# Patient Record
Sex: Female | Born: 1975 | Race: White | Hispanic: No | Smoking: Never smoker
Health system: Southern US, Community
[De-identification: ages and names within clinical notes are randomized; demographics above are authoritative.]

## PROBLEM LIST (undated history)

## (undated) DIAGNOSIS — F32A Depression, unspecified: Secondary | ICD-10-CM

## (undated) DIAGNOSIS — I10 Essential (primary) hypertension: Secondary | ICD-10-CM

## (undated) DIAGNOSIS — U071 COVID-19: Secondary | ICD-10-CM

## (undated) DIAGNOSIS — F329 Major depressive disorder, single episode, unspecified: Secondary | ICD-10-CM

## (undated) DIAGNOSIS — E785 Hyperlipidemia, unspecified: Secondary | ICD-10-CM

## (undated) HISTORY — PX: WISDOM TOOTH EXTRACTION: SHX21

## (undated) HISTORY — DX: Essential (primary) hypertension: I10

## (undated) HISTORY — DX: Hyperlipidemia, unspecified: E78.5

## (undated) HISTORY — DX: Depression, unspecified: F32.A

## (undated) HISTORY — DX: Major depressive disorder, single episode, unspecified: F32.9

## (undated) HISTORY — DX: COVID-19: U07.1

---

## 2007-04-07 ENCOUNTER — Emergency Department: Payer: Self-pay | Admitting: Emergency Medicine

## 2007-04-24 ENCOUNTER — Encounter: Payer: Self-pay | Admitting: Family Medicine

## 2007-04-28 ENCOUNTER — Encounter: Payer: Self-pay | Admitting: Family Medicine

## 2009-04-11 ENCOUNTER — Encounter: Payer: Self-pay | Admitting: Maternal & Fetal Medicine

## 2009-04-12 ENCOUNTER — Encounter: Payer: Self-pay | Admitting: Pediatric Cardiology

## 2009-04-26 ENCOUNTER — Observation Stay: Payer: Self-pay | Admitting: Obstetrics and Gynecology

## 2009-06-03 ENCOUNTER — Observation Stay: Payer: Self-pay | Admitting: Obstetrics and Gynecology

## 2009-06-07 ENCOUNTER — Inpatient Hospital Stay: Payer: Self-pay | Admitting: Obstetrics and Gynecology

## 2010-03-22 ENCOUNTER — Emergency Department: Payer: Self-pay | Admitting: Emergency Medicine

## 2017-04-15 ENCOUNTER — Ambulatory Visit (INDEPENDENT_AMBULATORY_CARE_PROVIDER_SITE_OTHER): Payer: BC Managed Care – PPO | Admitting: Certified Nurse Midwife

## 2017-04-15 ENCOUNTER — Encounter: Payer: Self-pay | Admitting: Certified Nurse Midwife

## 2017-04-15 VITALS — BP 119/76 | HR 56 | Ht 64.0 in | Wt 243.3 lb

## 2017-04-15 DIAGNOSIS — N898 Other specified noninflammatory disorders of vagina: Secondary | ICD-10-CM | POA: Diagnosis not present

## 2017-04-15 DIAGNOSIS — R638 Other symptoms and signs concerning food and fluid intake: Secondary | ICD-10-CM

## 2017-04-15 DIAGNOSIS — Z0000 Encounter for general adult medical examination without abnormal findings: Secondary | ICD-10-CM

## 2017-04-15 DIAGNOSIS — Z01419 Encounter for gynecological examination (general) (routine) without abnormal findings: Secondary | ICD-10-CM | POA: Diagnosis not present

## 2017-04-15 NOTE — Progress Notes (Signed)
New pt is here for a pap smear. No history of abnormal.States she needs help losing weight. Last period was not normal for her and spotted 8/1-03/28/17,04/03/17 and 04/06/17. Also states she has a discharge with a "sweaty" odor.

## 2017-04-15 NOTE — Patient Instructions (Signed)

## 2017-04-15 NOTE — Progress Notes (Signed)
ANNUAL PREVENTATIVE CARE GYN  ENCOUNTER NOTE  Subjective:       Emily Huffman is a 41 y.o. G16P3003 female here for a routine annual gynecologic exam.  Current complaints: 1.  Unable to loose weight. She states that she has been trying to loose weight for the past year with exercise and diet and has little to no success. She said she was exercising 3-4 days a week but Is not currently. She states she has changed her diet.   2. Increased vaginal discharge for " a while", no burning or itching. She does complain of an odor.    Gynecologic History Patient's last menstrual period was 03/25/2017 (exact date). Contraception: condoms Last Pap: a few years ago. Results were: normal per pt.  Last mammogram: never had, ordered .}  Obstetric History OB History  Gravida Para Term Preterm AB Living  3 3 3     3   SAB TAB Ectopic Multiple Live Births          3    # Outcome Date GA Lbr Len/2nd Weight Sex Delivery Anes PTL Lv  3 Term 2010    F Vag-Spont  N LIV  2 Term 2001    M Vag-Spont  N LIV  1 Term 1999    F Vag-Spont  N LIV      History reviewed. No pertinent past medical history.  History reviewed. No pertinent surgical history.  No current outpatient prescriptions on file prior to visit.   No current facility-administered medications on file prior to visit.     Not on File  Social History   Social History  . Marital status: Divorced    Spouse name: N/A  . Number of children: N/A  . Years of education: N/A   Occupational History  . Not on file.   Social History Main Topics  . Smoking status: Never Smoker  . Smokeless tobacco: Never Used  . Alcohol use Yes     Comment: occas  . Drug use: No  . Sexual activity: Yes    Birth control/ protection: Condom   Other Topics Concern  . Not on file   Social History Narrative  . No narrative on file    Family History  Problem Relation Age of Onset  . Stroke Father   . Diabetes Maternal Grandmother   . Stroke Paternal  Grandmother     The following portions of the patient's history were reviewed and updated as appropriate: allergies, current medications, past family history, past medical history, past social history, past surgical history and problem list.  Review of Systems ROS Review of Systems - General ROS: negative for - chills, fatigue, fever, hot flashes, night sweats, weight gain or weight loss Psychological ROS: negative for - anxiety, decreased libido, depression, mood swings, physical abuse or sexual abuse Ophthalmic ROS: negative for - blurry vision, eye pain or loss of vision ENT ROS: negative for - headaches, hearing change, visual changes or vocal changes Allergy and Immunology ROS: negative for - hives, itchy/watery eyes or seasonal allergies Hematological and Lymphatic ROS: negative for - bleeding problems, bruising, swollen lymph nodes or weight loss Endocrine ROS: negative for - galactorrhea, hair pattern changes, hot flashes, malaise/lethargy, mood swings, palpitations, polydipsia/polyuria, skin changes, temperature intolerance or unexpected weight changes. Positive for inability to lose weight. Breast ROS: negative for - new or changing breast lumps or nipple discharge Respiratory ROS: negative for - cough or shortness of breath Cardiovascular ROS: negative for - chest pain, irregular  heartbeat, palpitations or shortness of breath Gastrointestinal ROS: no abdominal pain, change in bowel habits, or black or bloody stools Genito-Urinary ROS: no dysuria, trouble voiding, or hematuria Musculoskeletal ROS: negative for - joint pain or joint stiffness Neurological ROS: negative for - bowel and bladder control changes Dermatological ROS: negative for rash and skin lesion changes   Objective:   BP 130/89   Pulse (!) 57   Ht 5\' 4"  (1.626 m)   Wt 243 lb 5 oz (110.4 kg)   LMP 03/25/2017 (Exact Date)   BMI 41.76 kg/m  CONSTITUTIONAL: Well-developed, well-nourished, obese female in no acute  distress.  PSYCHIATRIC: Normal mood and affect. Normal behavior. Normal judgment and thought content. NEUROLGIC: Alert and oriented to person, place, and time. Normal muscle tone coordination. No cranial nerve deficit noted. HENT:  Normocephalic, atraumatic, External right and left ear normal. Oropharynx is clear and moist EYES: Conjunctivae and EOM are normal. . No scleral icterus.  NECK: Normal range of motion, supple, no masses.  Normal thyroid. Slightly larger in pt right side.  SKIN: Skin is warm and dry. No rash noted. Not diaphoretic. No erythema. No pallor. CARDIOVASCULAR: Normal heart rate noted, regular rhythm, no murmur. RESPIRATORY: Clear to auscultation bilaterally. Effort and breath sounds normal, no problems with respiration noted. BREASTS: Symmetric in size. No masses, skin changes, nipple drainage, or lymphadenopathy. ABDOMEN: Soft, normal bowel sounds, no distention noted.  No tenderness, rebound or guarding.  BLADDER: Normal PELVIC:  External Genitalia: Normal  BUS: Normal  Vagina: Normal , clear to white discharge with odor  Cervix: Normal  Uterus: Normal, difficult to assess due to body habitus  Adnexa: Normal, difficult to evaluate due to body habitus  RV: External Exam NormaI  MUSCULOSKELETAL: Normal range of motion. No tenderness.  No cyanosis, clubbing, or edema.  2+ distal pulses. LYMPHATIC: No Axillary, Supraclavicular, or Inguinal Adenopathy.    Assessment:   Annual gynecologic examination 41 y.o. Contraception: condoms Obesity 2 Problem List Items Addressed This Visit    None      Plan:  Pap: Pap IG and HPV Mammogram: Ordered Stool Guaiac Testing:  Not Indicated Labs: Nuswab for BV & yeast, Lipid 1, TSH and Hemoglobin A1C Routine preventative health maintenance measures emphasized: Exercise/Diet/Weight control and Stress Management Will return to complete lab draw. Will follow up with results. Recommended pt see Melody Shambly, CNM to discuss  weight loss management.  Return to Clinic - 1 Year or PRN  Doreene Burke, CNM

## 2017-04-18 ENCOUNTER — Other Ambulatory Visit: Payer: BC Managed Care – PPO

## 2017-04-18 ENCOUNTER — Telehealth: Payer: Self-pay

## 2017-04-18 DIAGNOSIS — Z0000 Encounter for general adult medical examination without abnormal findings: Secondary | ICD-10-CM

## 2017-04-18 DIAGNOSIS — R638 Other symptoms and signs concerning food and fluid intake: Secondary | ICD-10-CM

## 2017-04-18 LAB — PAP IG AND HPV HIGH-RISK
HPV, HIGH-RISK: NEGATIVE
PAP SMEAR COMMENT: 0

## 2017-04-18 NOTE — Telephone Encounter (Signed)
Left message that the [facility] number is [phone]. Also informed of HBA1C was added to her blood work. Any questions to contact office.

## 2017-04-19 ENCOUNTER — Telehealth: Payer: Self-pay

## 2017-04-19 LAB — HEMOGLOBIN A1C
Est. average glucose Bld gHb Est-mCnc: 111 mg/dL
Hgb A1c MFr Bld: 5.5 % (ref 4.8–5.6)

## 2017-04-19 LAB — LIPID PANEL
CHOL/HDL RATIO: 4 ratio (ref 0.0–4.4)
Cholesterol, Total: 206 mg/dL — ABNORMAL HIGH (ref 100–199)
HDL: 52 mg/dL (ref >39)
LDL Calculated: 131 mg/dL — ABNORMAL HIGH (ref 0–99)
TRIGLYCERIDES: 115 mg/dL (ref 0–149)
VLDL CHOLESTEROL CAL: 23 mg/dL (ref 5–40)

## 2017-04-19 LAB — TSH: TSH: 2.05 u[IU]/mL (ref 0.450–4.500)

## 2017-04-19 LAB — NUSWAB BV AND CANDIDA, NAA
Atopobium vaginae: HIGH Score — AB
BVAB 2: HIGH {score} — AB
CANDIDA ALBICANS, NAA: NEGATIVE
CANDIDA GLABRATA, NAA: NEGATIVE
Megasphaera 1: HIGH Score — AB

## 2017-04-19 NOTE — Telephone Encounter (Signed)
Attempted to contact pt- phone rang twice then dead silence.

## 2017-04-22 ENCOUNTER — Other Ambulatory Visit: Payer: Self-pay | Admitting: Certified Nurse Midwife

## 2017-04-22 ENCOUNTER — Telehealth: Payer: Self-pay

## 2017-04-22 NOTE — Telephone Encounter (Signed)
Attempted to contact pt- unable to leave message- states unavailable.

## 2017-04-22 NOTE — Telephone Encounter (Signed)
Called patient to review results of pap smear and nuswab. Message left for her to call back.   Doreene Burke, CNM

## 2017-04-22 NOTE — Telephone Encounter (Signed)
Pt stated she was returning nurse call. Please advise. Thanks TNP °

## 2017-04-23 ENCOUNTER — Telehealth: Payer: Self-pay | Admitting: Certified Nurse Midwife

## 2017-04-23 ENCOUNTER — Telehealth: Payer: Self-pay

## 2017-04-23 MED ORDER — METRONIDAZOLE 500 MG PO TABS: 500.0000 mg | ORAL_TABLET | Freq: Two times a day (BID) | ORAL | 0 refills | Status: AC

## 2017-04-23 NOTE — Telephone Encounter (Signed)
Pt called to notify of pap smear results and nuswab result. Discussed BV and treatment. Order placed to pharmacy .   Doreene Burke, CNM

## 2017-04-23 NOTE — Telephone Encounter (Signed)
Pt stated she is return nurse call and would like a call back. CB# [phone]. Thanks TNP

## 2017-04-23 NOTE — Telephone Encounter (Signed)
Informed pt of providers instructions. Information mailed to pt per pt request.

## 2017-04-30 ENCOUNTER — Telehealth: Payer: Self-pay | Admitting: Certified Nurse Midwife

## 2017-04-30 NOTE — Telephone Encounter (Signed)
Patient called and stated that she went to her pharmacy and the pharmacy had no record of the medication ever being sent. The patient never got her prescription and would like to be called back or have the prescription sent again. The patient did not disclose any other information. Please advise.

## 2017-05-01 NOTE — Telephone Encounter (Signed)
Left message to contact office

## 2017-05-02 ENCOUNTER — Ambulatory Visit (INDEPENDENT_AMBULATORY_CARE_PROVIDER_SITE_OTHER): Payer: BC Managed Care – PPO | Admitting: Obstetrics and Gynecology

## 2017-05-02 ENCOUNTER — Encounter: Payer: Self-pay | Admitting: Obstetrics and Gynecology

## 2017-05-02 VITALS — BP 144/100 | HR 65 | Ht 64.0 in | Wt 240.4 lb

## 2017-05-02 DIAGNOSIS — E669 Obesity, unspecified: Secondary | ICD-10-CM

## 2017-05-02 DIAGNOSIS — G44201 Tension-type headache, unspecified, intractable: Secondary | ICD-10-CM

## 2017-05-02 DIAGNOSIS — R030 Elevated blood-pressure reading, without diagnosis of hypertension: Secondary | ICD-10-CM

## 2017-05-02 NOTE — Progress Notes (Signed)
Subjective:     Patient ID: Emily Huffman, female   DOB: 03-01-1976, 41 y.o.   MRN: [id number]  HPI Here to discuss weight loss program. But Blood pressure is high. Thinks it is due to persistant headache since last night. Rates a 3 on pain scale 0-10. And states muscle just feel tight.  Is currently not exercising but plans to start.  Review of Systems Negative at this time    Objective:   Physical Exam A&O x4 Well groomed female in no distress Blood pressure (!) 144/100, pulse 65, height '5\' 4"'  (1.626 m), weight 240 lb 6.4 oz (109 kg), last menstrual period 04/24/2017. Second BP 132/94 PERRL HRR No swelling     Assessment:     obesity Elevated blood pressure headache    Plan:     Will have nurse at work check BP and send readings via myChart. If stable will return to start weight loss medications.  Whitfield Dulay [place], CNM

## 2017-05-02 NOTE — Telephone Encounter (Signed)
Pt called and stated it was the pharmacy and it was straight now.

## 2017-05-02 NOTE — Patient Instructions (Signed)
Thank you for enrolling in MyChart. Please follow the instructions below to securely access your online medical record. MyChart allows you to send messages to your doctor, view your test results, renew your prescriptions, schedule appointments, and more.  How Do I Sign Up? 1. In your Internet browser, go to [email or web address]. 2. Click on the New  User? link in the Sign In box.  3. Enter your MyChart Access Code exactly as it appears below. You will not need to use this code after you have completed the sign-up process. If you do not sign up before the expiration date, you must request a new code. MyChart Access Code: [email or web address] Expires: 06/22/2017  4:43 PM  4. Enter the last four digits of your Social Security Number (xxxx) and Date of Birth (mm/dd/yyyy) as indicated and click Next. You will be taken to the next sign-up page. 5. Create a MyChart ID. This will be your MyChart login ID and cannot be changed, so think of one that is secure and easy to remember. 6. Create a MyChart password. You can change your password at any time. 7. Enter your Password Reset Question and Answer and click Next. This can be used at a later time if you forget your password.  8. Select your communication preference, and if applicable enter your e-mail address. You will receive e-mail notification when new information is available in MyChart by choosing to receive e-mail notifications and filling in your e-mail. 9. Click Sign In. You can now view your medical record.   Additional Information If you have questions, you can email [email or web address] or call [phone] to talk to our MyChart staff. Remember, MyChart is NOT to be used for urgent needs. For medical emergencies, dial 911.

## 2017-05-02 NOTE — Telephone Encounter (Signed)
Unable to reach pt. Phone rang and rang then stopped.

## 2017-06-06 ENCOUNTER — Encounter: Payer: BC Managed Care – PPO | Admitting: Obstetrics and Gynecology

## 2018-02-13 ENCOUNTER — Encounter: Payer: Self-pay | Admitting: Internal Medicine

## 2018-02-13 ENCOUNTER — Ambulatory Visit (INDEPENDENT_AMBULATORY_CARE_PROVIDER_SITE_OTHER): Payer: BC Managed Care – PPO

## 2018-02-13 ENCOUNTER — Ambulatory Visit (INDEPENDENT_AMBULATORY_CARE_PROVIDER_SITE_OTHER): Payer: BC Managed Care – PPO | Admitting: Internal Medicine

## 2018-02-13 VITALS — BP 120/90 | HR 54 | Temp 98.5°F | Ht 64.0 in | Wt 228.6 lb

## 2018-02-13 DIAGNOSIS — G8929 Other chronic pain: Secondary | ICD-10-CM

## 2018-02-13 DIAGNOSIS — E559 Vitamin D deficiency, unspecified: Secondary | ICD-10-CM | POA: Insufficient documentation

## 2018-02-13 DIAGNOSIS — F32A Depression, unspecified: Secondary | ICD-10-CM

## 2018-02-13 DIAGNOSIS — I10 Essential (primary) hypertension: Secondary | ICD-10-CM

## 2018-02-13 DIAGNOSIS — F329 Major depressive disorder, single episode, unspecified: Secondary | ICD-10-CM | POA: Diagnosis not present

## 2018-02-13 DIAGNOSIS — M7918 Myalgia, other site: Secondary | ICD-10-CM

## 2018-02-13 DIAGNOSIS — F339 Major depressive disorder, recurrent, unspecified: Secondary | ICD-10-CM | POA: Insufficient documentation

## 2018-02-13 DIAGNOSIS — M5442 Lumbago with sciatica, left side: Secondary | ICD-10-CM

## 2018-02-13 DIAGNOSIS — Z1231 Encounter for screening mammogram for malignant neoplasm of breast: Secondary | ICD-10-CM | POA: Diagnosis not present

## 2018-02-13 DIAGNOSIS — Z1283 Encounter for screening for malignant neoplasm of skin: Secondary | ICD-10-CM

## 2018-02-13 DIAGNOSIS — E785 Hyperlipidemia, unspecified: Secondary | ICD-10-CM | POA: Diagnosis not present

## 2018-02-13 DIAGNOSIS — R51 Headache: Secondary | ICD-10-CM | POA: Diagnosis not present

## 2018-02-13 DIAGNOSIS — E669 Obesity, unspecified: Secondary | ICD-10-CM | POA: Diagnosis not present

## 2018-02-13 DIAGNOSIS — R519 Headache, unspecified: Secondary | ICD-10-CM | POA: Insufficient documentation

## 2018-02-13 DIAGNOSIS — E66812 Obesity, class 2: Secondary | ICD-10-CM | POA: Insufficient documentation

## 2018-02-13 LAB — COMPREHENSIVE METABOLIC PANEL
ALBUMIN: 4.3 g/dL (ref 3.5–5.2)
ALK PHOS: 58 U/L (ref 39–117)
ALT: 16 U/L (ref 0–35)
AST: 13 U/L (ref 0–37)
BILIRUBIN TOTAL: 0.5 mg/dL (ref 0.2–1.2)
BUN: 19 mg/dL (ref 6–23)
CALCIUM: 9.2 mg/dL (ref 8.4–10.5)
CO2: 29 meq/L (ref 19–32)
CREATININE: 0.81 mg/dL (ref 0.40–1.20)
Chloride: 106 mEq/L (ref 96–112)
GFR: 82.29 mL/min (ref >60.00)
Glucose, Bld: 93 mg/dL (ref 70–99)
Potassium: 4.4 mEq/L (ref 3.5–5.1)
Sodium: 141 mEq/L (ref 135–145)
TOTAL PROTEIN: 7.2 g/dL (ref 6.0–8.3)

## 2018-02-13 LAB — CBC WITH DIFFERENTIAL/PLATELET
BASOS ABS: 0 10*3/uL (ref 0.0–0.1)
Basophils Relative: 0.7 % (ref 0.0–3.0)
EOS ABS: 0.3 10*3/uL (ref 0.0–0.7)
Eosinophils Relative: 5 % (ref 0.0–5.0)
HEMATOCRIT: 37.8 % (ref 36.0–46.0)
Hemoglobin: 12.9 g/dL (ref 12.0–15.0)
LYMPHS PCT: 26.6 % (ref 12.0–46.0)
Lymphs Abs: 1.7 10*3/uL (ref 0.7–4.0)
MCHC: 34.2 g/dL (ref 30.0–36.0)
MCV: 84.5 fl (ref 78.0–100.0)
MONOS PCT: 5.5 % (ref 3.0–12.0)
Monocytes Absolute: 0.4 10*3/uL (ref 0.1–1.0)
NEUTROS PCT: 62.2 % (ref 43.0–77.0)
Neutro Abs: 4 10*3/uL (ref 1.4–7.7)
Platelets: 306 10*3/uL (ref 150.0–400.0)
RBC: 4.47 Mil/uL (ref 3.87–5.11)
RDW: 13.8 % (ref 11.5–15.5)
WBC: 6.4 10*3/uL (ref 4.0–10.5)

## 2018-02-13 MED ORDER — AMLODIPINE BESYLATE 2.5 MG PO TABS: 2.5000 mg | ORAL_TABLET | Freq: Every day | ORAL | 1 refills | Status: DC

## 2018-02-13 MED ORDER — SERTRALINE HCL 25 MG PO TABS: 25.0000 mg | ORAL_TABLET | Freq: Every day | ORAL | 1 refills | Status: DC

## 2018-02-13 NOTE — Progress Notes (Signed)
Chief Complaint  Patient presents with  . Follow-up   New patient  1. HTN elevated today and had 144/100 and dbp 100s this week prior to visit  2. C/o depression and is tearful she keeps everything bottled up never been on meds. Also c/o reduced and interrupted sleep at night and tired during the day  3. Obesity BMI 39.24 just started exercising  4. H/o vit D def  5. C/o left buttock pain rad down leg w/o back pain    Review of Systems  Constitutional: Positive for weight loss.  HENT: Negative for hearing loss.   Eyes: Negative for blurred vision.  Respiratory: Negative for shortness of breath.   Cardiovascular: Negative for chest pain.  Gastrointestinal: Negative for abdominal pain.  Musculoskeletal: Negative for back pain.       +left buttock pain   Skin: Negative for rash.  Neurological: Positive for headaches.  Psychiatric/Behavioral: Positive for depression. The patient has insomnia.    No past medical history on file. No past surgical history on file. Family History  Problem Relation Age of Onset  . Stroke Father   . Diabetes Maternal Grandmother   . Stroke Paternal Grandmother    Social History   Socioeconomic History  . Marital status: Divorced    Spouse name: Not on file  . Number of children: Not on file  . Years of education: Not on file  . Highest education level: Not on file  Occupational History  . Not on file  Social Needs  . Financial resource strain: Not on file  . Food insecurity:    Worry: Not on file    Inability: Not on file  . Transportation needs:    Medical: Not on file    Non-medical: Not on file  Tobacco Use  . Smoking status: Never Smoker  . Smokeless tobacco: Never Used  Substance and Sexual Activity  . Alcohol use: Yes    Comment: occas  . Drug use: No  . Sexual activity: Yes    Birth control/protection: Condom  Lifestyle  . Physical activity:    Days per week: Not on file    Minutes per session: Not on file  . Stress: Not  on file  Relationships  . Social connections:    Talks on phone: Not on file    Gets together: Not on file    Attends religious service: Not on file    Active member of club or organization: Not on file    Attends meetings of clubs or organizations: Not on file    Relationship status: Not on file  . Intimate partner violence:    Fear of current or ex partner: Not on file    Emotionally abused: Not on file    Physically abused: Not on file    Forced sexual activity: Not on file  Other Topics Concern  . Not on file  Social History Narrative  . Not on file   No outpatient medications have been marked as taking for the 02/13/18 encounter (Office Visit) with McLean-Scocuzza, Nino Glow, MD.   No Known Allergies No results found for this or any previous visit (from the past 2160 hour(s)). Objective  Body mass index is 39.24 kg/m. Wt Readings from Last 3 Encounters:  02/13/18 228 lb 9.6 oz (103.7 kg)  05/02/17 240 lb 6.4 oz (109 kg)  04/15/17 243 lb 5 oz (110.4 kg)   Temp Readings from Last 3 Encounters:  02/13/18 98.5 F (36.9 C) (Oral)  BP Readings from Last 3 Encounters:  02/13/18 120/90  05/02/17 (!) 144/100  04/15/17 119/76   Pulse Readings from Last 3 Encounters:  02/13/18 (!) 54  05/02/17 65  04/15/17 (!) 56    Physical Exam  Constitutional: She is oriented to person, place, and time. Vital signs are normal. She appears well-developed and well-nourished. She is cooperative.  HENT:  Head: Normocephalic and atraumatic.  Mouth/Throat: Oropharynx is clear and moist and mucous membranes are normal.  Eyes: Pupils are equal, round, and reactive to light. Conjunctivae are normal.  Cardiovascular: Normal rate, regular rhythm and normal heart sounds.  Pulmonary/Chest: Effort normal and breath sounds normal.  Musculoskeletal: She exhibits no tenderness.       Right shoulder: She exhibits no tenderness.  Neurological: She is alert and oriented to person, place, and time. Gait  normal.  Skin: Skin is warm, dry and intact.  Psychiatric: She has a normal mood and affect. Her speech is normal and behavior is normal. Judgment and thought content normal. Cognition and memory are normal.  Nursing note and vitals reviewed.   Assessment   1. HTN/HLD 2. Depression/insomnia PHQ 9 score 17 today  3. Obesity BMI 39.24  4. Vit D def  5.HM  6. Left buttock pain  Plan   1. Start norvasc 2.5 mg qd  Healthier lifestyle 2. Start zoloft 25 mg qd   Consider melatonin qhs  Also disc trazadone but se is HTN  rec exercise to lose  3. rec exercise to lose healthy diet choices  4. Check vit D  5.  Flu shot had 2017  Tdap 07/11/17 neg  04/16/17 Shirlee [place] neg pap neg HPV [facility]  Referred mammogram and dermatology  Declines STD check, MMR and hep B Check labs today   6. Xray low back and pelvis today   [facility]  [facility]  Provider: Dr. Olivia Mackie McLean-Scocuzza-Internal Medicine

## 2018-02-13 NOTE — Progress Notes (Signed)
Pre visit review using our clinic review tool, if applicable. No additional management support is needed unless otherwise documented below in the visit note. 

## 2018-02-13 NOTE — Patient Instructions (Addendum)
Please call and sch mammogram  F/u in 1 month  Take care  Try meditation apps Insight Timer, Headspace or Calm    Exercising to Lose Weight Exercising can help you to lose weight. In order to lose weight through exercise, you need to do vigorous-intensity exercise. You can tell that you are exercising with vigorous intensity if you are breathing very hard and fast and cannot hold a conversation while exercising. Moderate-intensity exercise helps to maintain your current weight. You can tell that you are exercising at a moderate level if you have a higher heart rate and faster breathing, but you are still able to hold a conversation. How often should I exercise? Choose an activity that you enjoy and set realistic goals. Your health care provider can help you to make an activity plan that works for you. Exercise regularly as directed by your health care provider. This may include:  Doing resistance training twice each week, such as: ? Push-ups. ? Sit-ups. ? Lifting weights. ? Using resistance bands.  Doing a given intensity of exercise for a given amount of time. Choose from these options: ? 150 minutes of moderate-intensity exercise every week. ? 75 minutes of vigorous-intensity exercise every week. ? A mix of moderate-intensity and vigorous-intensity exercise every week.  Children, pregnant women, people who are out of shape, people who are overweight, and older adults may need to consult a health care provider for individual recommendations. If you have any sort of medical condition, be sure to consult your health care provider before starting a new exercise program. What are some activities that can help me to lose weight?  Walking at a rate of at least 4.5 miles an hour.  Jogging or running at a rate of 5 miles per hour.  Biking at a rate of at least 10 miles per hour.  Lap swimming.  Roller-skating or in-line skating.  Cross-country skiing.  Vigorous competitive sports, such  as football, basketball, and soccer.  Jumping rope.  Aerobic dancing. How can I be more active in my day-to-day activities?  Use the stairs instead of the elevator.  Take a walk during your lunch break.  If you drive, park your car farther away from work or school.  If you take public transportation, get off one stop early and walk the rest of the way.  Make all of your phone calls while standing up and walking around.  Get up, stretch, and walk around every 30 minutes throughout the day. What guidelines should I follow while exercising?  Do not exercise so much that you hurt yourself, feel dizzy, or get very short of breath.  Consult your health care provider prior to starting a new exercise program.  Wear comfortable clothes and shoes with good support.  Drink plenty of water while you exercise to prevent dehydration or heat stroke. Body water is lost during exercise and must be replaced.  Work out until you breathe faster and your heart beats faster. This information is not intended to replace advice given to you by your health care provider. Make sure you discuss any questions you have with your health care provider. Document Released: 09/15/2010 Document Revised: 01/19/2016 Document Reviewed: 01/14/2014 Elsevier Interactive Patient Education  2018 [employer].   Hypertension Hypertension, commonly called high blood pressure, is when the force of blood pumping through the arteries is too strong. The arteries are the blood vessels that carry blood from the heart throughout the body. Hypertension forces the heart to work harder to  pump blood and may cause arteries to become narrow or stiff. Having untreated or uncontrolled hypertension can cause heart attacks, strokes, kidney disease, and other problems. A blood pressure reading consists of a higher number over a lower number. Ideally, your blood pressure should be below 120/80. The first ("top") number is called the systolic  pressure. It is a measure of the pressure in your arteries as your heart beats. The second ("bottom") number is called the diastolic pressure. It is a measure of the pressure in your arteries as the heart relaxes. What are the causes? The cause of this condition is not known. What increases the risk? Some risk factors for high blood pressure are under your control. Others are not. Factors you can change  Smoking.  Having type 2 diabetes mellitus, high cholesterol, or both.  Not getting enough exercise or physical activity.  Being overweight.  Having too much fat, sugar, calories, or salt (sodium) in your diet.  Drinking too much alcohol. Factors that are difficult or impossible to change  Having chronic kidney disease.  Having a family history of high blood pressure.  Age. Risk increases with age.  Race. You may be at higher risk if you are African-American.  Gender. Men are at higher risk than women before age 42. After age 42, women are at higher risk than men.  Having obstructive sleep apnea.  Stress. What are the signs or symptoms? Extremely high blood pressure (hypertensive crisis) may cause:  Headache.  Anxiety.  Shortness of breath.  Nosebleed.  Nausea and vomiting.  Severe chest pain.  Jerky movements you cannot control (seizures).  How is this diagnosed? This condition is diagnosed by measuring your blood pressure while you are seated, with your arm resting on a surface. The cuff of the blood pressure monitor will be placed directly against the skin of your upper arm at the level of your heart. It should be measured at least twice using the same arm. Certain conditions can cause a difference in blood pressure between your right and left arms. Certain factors can cause blood pressure readings to be lower or higher than normal (elevated) for a short period of time:  When your blood pressure is higher when you are in a health care provider's office than  when you are at home, this is called white coat hypertension. Most people with this condition do not need medicines.  When your blood pressure is higher at home than when you are in a health care provider's office, this is called masked hypertension. Most people with this condition may need medicines to control blood pressure.  If you have a high blood pressure reading during one visit or you have normal blood pressure with other risk factors:  You may be asked to return on a different day to have your blood pressure checked again.  You may be asked to monitor your blood pressure at home for 1 week or longer.  If you are diagnosed with hypertension, you may have other blood or imaging tests to help your health care provider understand your overall risk for other conditions. How is this treated? This condition is treated by making healthy lifestyle changes, such as eating healthy foods, exercising more, and reducing your alcohol intake. Your health care provider may prescribe medicine if lifestyle changes are not enough to get your blood pressure under control, and if:  Your systolic blood pressure is above 130.  Your diastolic blood pressure is above 80.  Your personal target blood  pressure may vary depending on your medical conditions, your age, and other factors. Follow these instructions at home: Eating and drinking  Eat a diet that is high in fiber and potassium, and low in sodium, added sugar, and fat. An example eating plan is called the DASH (Dietary Approaches to Stop Hypertension) diet. To eat this way: ? Eat plenty of fresh fruits and vegetables. Try to fill half of your plate at each meal with fruits and vegetables. ? Eat whole grains, such as whole wheat pasta, brown rice, or whole grain bread. Fill about one quarter of your plate with whole grains. ? Eat or drink low-fat dairy products, such as skim milk or low-fat yogurt. ? Avoid fatty cuts of meat, processed or cured meats,  and poultry with skin. Fill about one quarter of your plate with lean proteins, such as fish, chicken without skin, beans, eggs, and tofu. ? Avoid premade and processed foods. These tend to be higher in sodium, added sugar, and fat.  Reduce your daily sodium intake. Most people with hypertension should eat less than 1,500 mg of sodium a day.  Limit alcohol intake to no more than 1 drink a day for nonpregnant women and 2 drinks a day for men. One drink equals 12 oz of beer, 5 oz of wine, or 1 oz of hard liquor. Lifestyle  Work with your health care provider to maintain a healthy body weight or to lose weight. Ask what an ideal weight is for you.  Get at least 30 minutes of exercise that causes your heart to beat faster (aerobic exercise) most days of the week. Activities may include walking, swimming, or biking.  Include exercise to strengthen your muscles (resistance exercise), such as pilates or lifting weights, as part of your weekly exercise routine. Try to do these types of exercises for 30 minutes at least 3 days a week.  Do not use any products that contain nicotine or tobacco, such as cigarettes and e-cigarettes. If you need help quitting, ask your health care provider.  Monitor your blood pressure at home as told by your health care provider.  Keep all follow-up visits as told by your health care provider. This is important. Medicines  Take over-the-counter and prescription medicines only as told by your health care provider. Follow directions carefully. Blood pressure medicines must be taken as prescribed.  Do not skip doses of blood pressure medicine. Doing this puts you at risk for problems and can make the medicine less effective.  Ask your health care provider about side effects or reactions to medicines that you should watch for. Contact a health care provider if:  You think you are having a reaction to a medicine you are taking.  You have headaches that keep coming back  (recurring).  You feel dizzy.  You have swelling in your ankles.  You have trouble with your vision. Get help right away if:  You develop a severe headache or confusion.  You have unusual weakness or numbness.  You feel faint.  You have severe pain in your chest or abdomen.  You vomit repeatedly.  You have trouble breathing. Summary  Hypertension is when the force of blood pumping through your arteries is too strong. If this condition is not controlled, it may put you at risk for serious complications.  Your personal target blood pressure may vary depending on your medical conditions, your age, and other factors. For most people, a normal blood pressure is less than 120/80.  Hypertension is  treated with lifestyle changes, medicines, or a combination of both. Lifestyle changes include weight loss, eating a healthy, low-sodium diet, exercising more, and limiting alcohol. This information is not intended to replace advice given to you by your health care provider. Make sure you discuss any questions you have with your health care provider. Document Released: 08/13/2005 Document Revised: 07/11/2016 Document Reviewed: 07/11/2016 Elsevier Interactive Patient Education  2018 [employer].  DASH Eating Plan DASH stands for "Dietary Approaches to Stop Hypertension." The DASH eating plan is a healthy eating plan that has been shown to reduce high blood pressure (hypertension). It may also reduce your risk for type 2 diabetes, heart disease, and stroke. The DASH eating plan may also help with weight loss. What are tips for following this plan? General guidelines  Avoid eating more than 2,300 mg (milligrams) of salt (sodium) a day. If you have hypertension, you may need to reduce your sodium intake to 1,500 mg a day.  Limit alcohol intake to no more than 1 drink a day for nonpregnant women and 2 drinks a day for men. One drink equals 12 oz of beer, 5 oz of wine, or 1 oz of hard  liquor.  Work with your health care provider to maintain a healthy body weight or to lose weight. Ask what an ideal weight is for you.  Get at least 30 minutes of exercise that causes your heart to beat faster (aerobic exercise) most days of the week. Activities may include walking, swimming, or biking.  Work with your health care provider or diet and nutrition specialist (dietitian) to adjust your eating plan to your individual calorie needs. Reading food labels  Check food labels for the amount of sodium per serving. Choose foods with less than 5 percent of the Daily Value of sodium. Generally, foods with less than 300 mg of sodium per serving fit into this eating plan.  To find whole grains, look for the word "whole" as the first word in the ingredient list. Shopping  Buy products labeled as "low-sodium" or "no salt added."  Buy fresh foods. Avoid canned foods and premade or frozen meals. Cooking  Avoid adding salt when cooking. Use salt-free seasonings or herbs instead of table salt or sea salt. Check with your health care provider or pharmacist before using salt substitutes.  Do not fry foods. Cook foods using healthy methods such as baking, boiling, grilling, and broiling instead.  Cook with heart-healthy oils, such as olive, canola, soybean, or sunflower oil. Meal planning   Eat a balanced diet that includes: ? 5 or more servings of fruits and vegetables each day. At each meal, try to fill half of your plate with fruits and vegetables. ? Up to 6-8 servings of whole grains each day. ? Less than 6 oz of lean meat, poultry, or fish each day. A 3-oz serving of meat is about the same size as a deck of cards. One egg equals 1 oz. ? 2 servings of low-fat dairy each day. ? A serving of nuts, seeds, or beans 5 times each week. ? Heart-healthy fats. Healthy fats called Omega-3 fatty acids are found in foods such as flaxseeds and coldwater fish, like sardines, salmon, and  mackerel.  Limit how much you eat of the following: ? Canned or prepackaged foods. ? Food that is high in trans fat, such as fried foods. ? Food that is high in saturated fat, such as fatty meat. ? Sweets, desserts, sugary drinks, and other foods with added sugar. ?  Full-fat dairy products.  Do not salt foods before eating.  Try to eat at least 2 vegetarian meals each week.  Eat more home-cooked food and less restaurant, buffet, and fast food.  When eating at a restaurant, ask that your food be prepared with less salt or no salt, if possible. What foods are recommended? The items listed may not be a complete list. Talk with your dietitian about what dietary choices are best for you. Grains Whole-grain or whole-wheat bread. Whole-grain or whole-wheat pasta. Brown rice. Orpah Cobb. Bulgur. Whole-grain and low-sodium cereals. Pita bread. Low-fat, low-sodium crackers. Whole-wheat flour tortillas. Vegetables Fresh or frozen vegetables (raw, steamed, roasted, or grilled). Low-sodium or reduced-sodium tomato and vegetable juice. Low-sodium or reduced-sodium tomato sauce and tomato paste. Low-sodium or reduced-sodium canned vegetables. Fruits All fresh, dried, or frozen fruit. Canned fruit in natural juice (without added sugar). Meat and other protein foods Skinless chicken or [place]. Ground chicken or [place]. Pork with fat trimmed off. Fish and seafood. Egg whites. Dried beans, peas, or lentils. Unsalted nuts, nut butters, and seeds. Unsalted canned beans. Lean cuts of beef with fat trimmed off. Low-sodium, lean deli meat. Dairy Low-fat (1%) or fat-free (skim) milk. Fat-free, low-fat, or reduced-fat cheeses. Nonfat, low-sodium ricotta or cottage cheese. Low-fat or nonfat yogurt. Low-fat, low-sodium cheese. Fats and oils Soft margarine without trans fats. Vegetable oil. Low-fat, reduced-fat, or light mayonnaise and salad dressings (reduced-sodium). Canola, safflower, olive, soybean, and  sunflower oils. Avocado. Seasoning and other foods Herbs. Spices. Seasoning mixes without salt. Unsalted popcorn and pretzels. Fat-free sweets. What foods are not recommended? The items listed may not be a complete list. Talk with your dietitian about what dietary choices are best for you. Grains Baked goods made with fat, such as croissants, muffins, or some breads. Dry pasta or rice meal packs. Vegetables Creamed or fried vegetables. Vegetables in a cheese sauce. Regular canned vegetables (not low-sodium or reduced-sodium). Regular canned tomato sauce and paste (not low-sodium or reduced-sodium). Regular tomato and vegetable juice (not low-sodium or reduced-sodium). Rosita Fire. Olives. Fruits Canned fruit in a light or heavy syrup. Fried fruit. Fruit in cream or butter sauce. Meat and other protein foods Fatty cuts of meat. Ribs. Fried meat. Tomasa Blase. Sausage. Bologna and other processed lunch meats. Salami. Fatback. Hotdogs. Bratwurst. Salted nuts and seeds. Canned beans with added salt. Canned or smoked fish. Whole eggs or egg yolks. Chicken or [place] with skin. Dairy Whole or 2% milk, cream, and half-and-half. Whole or full-fat cream cheese. Whole-fat or sweetened yogurt. Full-fat cheese. Nondairy creamers. Whipped toppings. Processed cheese and cheese spreads. Fats and oils Butter. Stick margarine. Lard. Shortening. Ghee. Bacon fat. Tropical oils, such as coconut, palm kernel, or palm oil. Seasoning and other foods Salted popcorn and pretzels. Onion salt, garlic salt, seasoned salt, table salt, and sea salt. Worcestershire sauce. Tartar sauce. Barbecue sauce. Teriyaki sauce. Soy sauce, including reduced-sodium. Steak sauce. Canned and packaged gravies. Fish sauce. Oyster sauce. Cocktail sauce. Horseradish that you find on the shelf. Ketchup. Mustard. Meat flavorings and tenderizers. Bouillon cubes. Hot sauce and Tabasco sauce. Premade or packaged marinades. Premade or packaged taco seasonings.  Relishes. Regular salad dressings. Where to find more information:  National Heart, Lung, and Blood Institute: PopSteam.is  American Heart Association: www.heart.org Summary  The DASH eating plan is a healthy eating plan that has been shown to reduce high blood pressure (hypertension). It may also reduce your risk for type 2 diabetes, heart disease, and stroke.  With the DASH eating plan, you  should limit salt (sodium) intake to 2,300 mg a day. If you have hypertension, you may need to reduce your sodium intake to 1,500 mg a day.  When on the DASH eating plan, aim to eat more fresh fruits and vegetables, whole grains, lean proteins, low-fat dairy, and heart-healthy fats.  Work with your health care provider or diet and nutrition specialist (dietitian) to adjust your eating plan to your individual calorie needs. This information is not intended to replace advice given to you by your health care provider. Make sure you discuss any questions you have with your health care provider. Document Released: 08/02/2011 Document Revised: 08/06/2016 Document Reviewed: 08/06/2016 Elsevier Interactive Patient Education  2018 [employer].  Cholesterol Cholesterol is a white, waxy, fat-like substance that is needed by the human body in small amounts. The liver makes all the cholesterol we need. Cholesterol is carried from the liver by the blood through the blood vessels. Deposits of cholesterol (plaques) may build up on blood vessel (artery) walls. Plaques make the arteries narrower and stiffer. Cholesterol plaques increase the risk for heart attack and stroke. You cannot feel your cholesterol level even if it is very high. The only way to know that it is high is to have a blood test. Once you know your cholesterol levels, you should keep a record of the test results. Work with your health care provider to keep your levels in the desired range. What do the results mean?  Total cholesterol is a rough  measure of all the cholesterol in your blood.  LDL (low-density lipoprotein) is the "bad" cholesterol. This is the type that causes plaque to build up on the artery walls. You want this level to be low.  HDL (high-density lipoprotein) is the "good" cholesterol because it cleans the arteries and carries the LDL away. You want this level to be high.  Triglycerides are fat that the body can either burn for energy or store. High levels are closely linked to heart disease. What are the desired levels of cholesterol?  Total cholesterol below 200.  LDL below 100 for people who are at risk, below 70 for people at very high risk.  HDL above 40 is good. A level of 60 or higher is considered to be protective against heart disease.  Triglycerides below 150. How can I lower my cholesterol? Diet Follow your diet program as told by your health care provider.  Choose fish or white meat chicken and [place], roasted or baked. Limit fatty cuts of red meat, fried foods, and processed meats, such as sausage and lunch meats.  Eat lots of fresh fruits and vegetables.  Choose whole grains, beans, pasta, potatoes, and cereals.  Choose olive oil, corn oil, or canola oil, and use only small amounts.  Avoid butter, mayonnaise, shortening, or palm kernel oils.  Avoid foods with trans fats.  Drink skim or nonfat milk and eat low-fat or nonfat yogurt and cheeses. Avoid whole milk, cream, ice cream, egg yolks, and full-fat cheeses.  Healthier desserts include angel food cake, ginger snaps, animal crackers, hard candy, popsicles, and low-fat or nonfat frozen yogurt. Avoid pastries, cakes, pies, and cookies.  Exercise  Follow your exercise program as told by your health care provider. A regular program: ? Helps to decrease LDL and raise HDL. ? Helps with weight control.  Do things that increase your activity level, such as gardening, walking, and taking the stairs.  Ask your health care provider about ways  that you can be more active  in your daily life.  Medicine  Take over-the-counter and prescription medicines only as told by your health care provider. ? Medicine may be prescribed by your health care provider to help lower cholesterol and decrease the risk for heart disease. This is usually done if diet and exercise have failed to bring down cholesterol levels. ? If you have several risk factors, you may need medicine even if your levels are normal.  This information is not intended to replace advice given to you by your health care provider. Make sure you discuss any questions you have with your health care provider. Document Released: 05/08/2001 Document Revised: 03/10/2016 Document Reviewed: 02/11/2016 Elsevier Interactive Patient Education  2018 [employer].  Major Depressive Disorder, Adult Major depressive disorder (MDD) is a mental health condition. MDD often makes you feel sad, hopeless, or helpless. MDD can also cause symptoms in your body. MDD can affect your:  Work.  School.  Relationships.  Other normal activities.  MDD can range from mild to very bad. It may occur once (single episode MDD). It can also occur many times (recurrent MDD). The main symptoms of MDD often include:  Feeling sad, depressed, or irritable most of the time.  Loss of interest.  MDD symptoms also include:  Sleeping too much or too little.  Eating too much or too little.  A change in your weight.  Feeling tired (fatigue) or having low energy.  Feeling worthless.  Feeling guilty.  Trouble making decisions.  Trouble thinking clearly.  Thoughts of suicide or harming others.  Feeling weak.  Feeling agitated.  Keeping yourself from being around other people (isolation).  Follow these instructions at home: Activity  Do these things as told by your doctor: ? Go back to your normal activities. ? Exercise regularly. ? Spend time outdoors. Alcohol  Talk with your doctor about  how alcohol can affect your antidepressant medicines.  Do not drink alcohol. Or, limit how much alcohol you drink. ? This means no more than 1 drink a day for nonpregnant women and 2 drinks a day for men. One drink equals one of these:  12 oz of beer.  5 oz of wine.  1 oz of hard liquor. General instructions  Take over-the-counter and prescription medicines only as told by your doctor.  Eat a healthy diet.  Get plenty of sleep.  Find activities that you enjoy. Make time to do them.  Think about joining a support group. Your doctor may be able to suggest a group for you.  Keep all follow-up visits as told by your doctor. This is important. Where to find more information:  [employer] on Mental Illness: ? www.nami.org  [employer] of Mental Health: ? http://www.maynard.net/  National Suicide Prevention Lifeline: ? (717)099-4615. This is free, 24-hour help. Contact a doctor if:  Your symptoms get worse.  You have new symptoms. Get help right away if:  You self-harm.  You see, hear, taste, smell, or feel things that are not present (hallucinate). If you ever feel like you may hurt yourself or others, or have thoughts about taking your own life, get help right away. You can go to your nearest emergency department or call:  Your local emergency services (911 in the [place]  A suicide crisis helpline, such as the National Suicide Prevention Lifeline: ? (402)479-0024. This is open 24 hours a day.  This information is not intended to replace advice given to you by your health care provider. Make sure you discuss any questions  you have with your health care provider. Document Released: 07/25/2015 Document Revised: 04/29/2016 Document Reviewed: 04/29/2016 Elsevier Interactive Patient Education  2017 Elsevier Inc.  Cholesterol Cholesterol is a white, waxy, fat-like substance that is needed by the human body in small amounts. The liver makes all the cholesterol  we need. Cholesterol is carried from the liver by the blood through the blood vessels. Deposits of cholesterol (plaques) may build up on blood vessel (artery) walls. Plaques make the arteries narrower and stiffer. Cholesterol plaques increase the risk for heart attack and stroke. You cannot feel your cholesterol level even if it is very high. The only way to know that it is high is to have a blood test. Once you know your cholesterol levels, you should keep a record of the test results. Work with your health care provider to keep your levels in the desired range. What do the results mean?  Total cholesterol is a rough measure of all the cholesterol in your blood.  LDL (low-density lipoprotein) is the "bad" cholesterol. This is the type that causes plaque to build up on the artery walls. You want this level to be low.  HDL (high-density lipoprotein) is the "good" cholesterol because it cleans the arteries and carries the LDL away. You want this level to be high.  Triglycerides are fat that the body can either burn for energy or store. High levels are closely linked to heart disease. What are the desired levels of cholesterol?  Total cholesterol below 200.  LDL below 100 for people who are at risk, below 70 for people at very high risk.  HDL above 40 is good. A level of 60 or higher is considered to be protective against heart disease.  Triglycerides below 150. How can I lower my cholesterol? Diet Follow your diet program as told by your health care provider.  Choose fish or white meat chicken and [place], roasted or baked. Limit fatty cuts of red meat, fried foods, and processed meats, such as sausage and lunch meats.  Eat lots of fresh fruits and vegetables.  Choose whole grains, beans, pasta, potatoes, and cereals.  Choose olive oil, corn oil, or canola oil, and use only small amounts.  Avoid butter, mayonnaise, shortening, or palm kernel oils.  Avoid foods with trans fats.  Drink  skim or nonfat milk and eat low-fat or nonfat yogurt and cheeses. Avoid whole milk, cream, ice cream, egg yolks, and full-fat cheeses.  Healthier desserts include angel food cake, ginger snaps, animal crackers, hard candy, popsicles, and low-fat or nonfat frozen yogurt. Avoid pastries, cakes, pies, and cookies.  Exercise  Follow your exercise program as told by your health care provider. A regular program: ? Helps to decrease LDL and raise HDL. ? Helps with weight control.  Do things that increase your activity level, such as gardening, walking, and taking the stairs.  Ask your health care provider about ways that you can be more active in your daily life.  Medicine  Take over-the-counter and prescription medicines only as told by your health care provider. ? Medicine may be prescribed by your health care provider to help lower cholesterol and decrease the risk for heart disease. This is usually done if diet and exercise have failed to bring down cholesterol levels. ? If you have several risk factors, you may need medicine even if your levels are normal.  This information is not intended to replace advice given to you by your health care provider. Make sure you discuss any questions  you have with your health care provider. Document Released: 05/08/2001 Document Revised: 03/10/2016 Document Reviewed: 02/11/2016 Elsevier Interactive Patient Education  [employer].

## 2018-02-14 ENCOUNTER — Other Ambulatory Visit: Payer: Self-pay | Admitting: Internal Medicine

## 2018-02-14 ENCOUNTER — Telehealth: Payer: Self-pay | Admitting: Internal Medicine

## 2018-02-14 DIAGNOSIS — E559 Vitamin D deficiency, unspecified: Secondary | ICD-10-CM

## 2018-02-14 LAB — URINALYSIS, ROUTINE W REFLEX MICROSCOPIC
Bilirubin, UA: NEGATIVE
Glucose, UA: NEGATIVE
Ketones, UA: NEGATIVE
LEUKOCYTES UA: NEGATIVE
NITRITE UA: NEGATIVE
PH UA: 6.5 (ref 5.0–7.5)
Protein, UA: NEGATIVE
RBC, UA: NEGATIVE
Specific Gravity, UA: 1.023 (ref 1.005–1.030)
Urobilinogen, Ur: 1 mg/dL (ref 0.2–1.0)

## 2018-02-14 LAB — VITAMIN D 25 HYDROXY (VIT D DEFICIENCY, FRACTURES): VITD: 20.75 ng/mL — ABNORMAL LOW (ref 30.00–100.00)

## 2018-02-14 MED ORDER — CHOLECALCIFEROL 1.25 MG (50000 UT) PO CAPS: 50000.0000 [IU] | ORAL_CAPSULE | ORAL | 1 refills | Status: DC

## 2018-02-14 NOTE — Telephone Encounter (Signed)
Copied from CRM [id number]. Topic: Quick Communication - Rx Refill/Question >> Feb 14, 2018  9:03 AM Louie BunPalacios Medina, Rosey Batheresa D wrote: Medication:Cholecalciferol 50000 units capsule  Has the patient contacted their pharmacy? Yes, pharmacy called to see if provider would change med since she are out of stock to Ergocalciserol 50,000 units instead. (Agent: If no, request that the patient contact the pharmacy for the refill.) (Agent: If yes, when and what did the pharmacy advise?)  Preferred Pharmacy (with phone number or street name): [facility] - [place], [place] - [address]  Agent: Please be advised that RX refills may take up to 3 business days. We ask that you follow-up with your pharmacy.

## 2018-02-17 NOTE — Telephone Encounter (Signed)
I sent this on 02/14/18 is she taking it 1x per week should not be out   TMS

## 2018-02-17 NOTE — Telephone Encounter (Signed)
She needs to start OTC Vit D correct?

## 2018-02-19 ENCOUNTER — Telehealth: Payer: Self-pay | Admitting: Internal Medicine

## 2018-02-19 NOTE — Telephone Encounter (Signed)
Copied from CRM [phone]. Topic: Quick Communication - Lab Results >> Feb 18, 2018 11:28 AM Bronwen BettersBooth, Brock T, CMA wrote: Called patient to inform them of [id number] lab results. When patient returns call, triage nurse may disclose results. Pt returning call for lab results

## 2018-02-19 NOTE — Telephone Encounter (Signed)
Message left for pt. To call about lab results.

## 2018-02-25 NOTE — Telephone Encounter (Signed)
Patient was not informed of the refill by pharmacy. She has not picked up RX.  She will contact pharmacy for pick up or call us back to ask for refill because pharmacy did not refill.

## 2018-03-05 ENCOUNTER — Ambulatory Visit
Admission: RE | Admit: 2018-03-05 | Discharge: 2018-03-05 | Disposition: A | Discharge status: Home / Self Care | Payer: BC Managed Care – PPO | Source: Ambulatory Visit | Attending: Internal Medicine | Admitting: Internal Medicine

## 2018-03-05 DIAGNOSIS — Z1231 Encounter for screening mammogram for malignant neoplasm of breast: Secondary | ICD-10-CM | POA: Diagnosis present

## 2018-04-04 ENCOUNTER — Encounter: Payer: Self-pay | Admitting: Internal Medicine

## 2018-04-04 ENCOUNTER — Ambulatory Visit: Payer: BC Managed Care – PPO | Admitting: Internal Medicine

## 2018-04-04 VITALS — BP 116/70 | HR 59 | Temp 98.7°F | Ht 64.0 in | Wt 229.2 lb

## 2018-04-04 DIAGNOSIS — E559 Vitamin D deficiency, unspecified: Secondary | ICD-10-CM | POA: Diagnosis not present

## 2018-04-04 DIAGNOSIS — I10 Essential (primary) hypertension: Secondary | ICD-10-CM

## 2018-04-04 DIAGNOSIS — M1990 Unspecified osteoarthritis, unspecified site: Secondary | ICD-10-CM

## 2018-04-04 DIAGNOSIS — E785 Hyperlipidemia, unspecified: Secondary | ICD-10-CM

## 2018-04-04 DIAGNOSIS — G4700 Insomnia, unspecified: Secondary | ICD-10-CM | POA: Insufficient documentation

## 2018-04-04 DIAGNOSIS — F329 Major depressive disorder, single episode, unspecified: Secondary | ICD-10-CM

## 2018-04-04 DIAGNOSIS — F32A Depression, unspecified: Secondary | ICD-10-CM

## 2018-04-04 DIAGNOSIS — M7918 Myalgia, other site: Secondary | ICD-10-CM | POA: Insufficient documentation

## 2018-04-04 DIAGNOSIS — E669 Obesity, unspecified: Secondary | ICD-10-CM

## 2018-04-04 MED ORDER — SERTRALINE HCL 50 MG PO TABS: 50.0000 mg | ORAL_TABLET | Freq: Every day | ORAL | 1 refills | Status: DC

## 2018-04-04 MED ORDER — CHOLECALCIFEROL 1.25 MG (50000 UT) PO CAPS: 50000.0000 [IU] | ORAL_CAPSULE | ORAL | 1 refills | Status: DC

## 2018-04-04 NOTE — Progress Notes (Signed)
Chief Complaint  Patient presents with  . Follow-up   F/u  1. HTN improved on norvasc 2.5  2. Mood prior PHQ 9 score 17 today 12 pt unsure if zoloft 25 mg is working but reports she does cry less. She tried melatonin 5 mg qhs for sleep but made her too groggy in the am and does not want to try anything for sleep. 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  3. Obesity wt stable overall up 1 lbs  4. Vit D def did not pick up order for D3 weekly need to reorder today 5. Still c/o left buttock pain radiating down her leg denies hip pain and Xrays with mild arthritis hips low back Xray + mild osteophytic changes as of 02/13/18 declines for now to see SM or ortho will let me know if worsening    Review of Systems  Constitutional: Negative for weight loss.  HENT: Negative for hearing loss.   Respiratory: Negative for shortness of breath.   Cardiovascular: Negative for chest pain.  Musculoskeletal: Positive for joint pain.  Skin: Negative for rash.  Psychiatric/Behavioral: Positive for depression. The patient has insomnia.    Past Medical History:  Diagnosis Date  . Hyperlipidemia   . Hypertension    No past surgical history on file. Family History  Problem Relation Age of Onset  . Stroke Father   . Diabetes Maternal Grandmother   . Heart disease Maternal Grandmother   . Stroke Paternal Grandmother   . Asthma Daughter   . Birth defects Son   . Intellectual disability Son   . Breast cancer Neg Hx    Social History   Socioeconomic History  . Marital status: Divorced    Spouse name: Not on file  . Number of children: Not on file  . Years of education: Not on file  . Highest education level: Not on file  Occupational History  . Not on file  Social Needs  . Financial resource strain: Not on file  . Food insecurity:    Worry: Not on file    Inability: Not on file  . Transportation needs:   Medical: Not on file    Non-medical: Not on file  Tobacco Use  . Smoking status: Never Smoker  . Smokeless tobacco: Never Used  Substance and Sexual Activity  . Alcohol use: Yes    Comment: occas  . Drug use: No  . Sexual activity: Yes    Birth control/protection: Condom  Lifestyle  . Physical activity:    Days per week: Not on file    Minutes per session: Not on file  . Stress: Not on file  Relationships  . Social connections:    Talks on phone: Not on file    Gets together: Not on file    Attends religious service: Not on file    Active member of club or organization: Not on file    Attends meetings of clubs or organizations: Not on file    Relationship status: Not on file  . Intimate partner violence:    Fear of current or ex partner: Not on file    Emotionally abused: Not on file    Physically abused: Not on file    Forced sexual activity: Not on file  Other Topics Concern  . Not on file  Social History Narrative   Divorced 3 kids 2 girls  1 boy ages 20/18/8 as of 02/13/18    TA K-1st grade    Wears selt belt   Safe in relationship    Never smoker    Current Meds  Medication Sig  . amLODipine (NORVASC) 2.5 MG tablet Take 1 tablet (2.5 mg total) by mouth daily.  . Cholecalciferol 50000 units capsule Take 1 capsule (50,000 Units total) by mouth once a week.  . sertraline (ZOLOFT) 25 MG tablet Take 1 tablet (25 mg total) by mouth daily.  . [DISCONTINUED] Cholecalciferol 50000 units capsule Take 1 capsule (50,000 Units total) by mouth once a week.   No Known Allergies Recent Results (from the past 2160 hour(s))  Comprehensive metabolic panel     Status: None   Collection Time: 02/13/18  2:14 PM  Result Value Ref Range   Sodium 141 135 - 145 mEq/L   Potassium 4.4 3.5 - 5.1 mEq/L   Chloride 106 96 - 112 mEq/L   CO2 29 19 - 32 mEq/L   Glucose, Bld 93 70 - 99 mg/dL   BUN 19 6 - 23 mg/dL   Creatinine, Ser 0.81 0.40 - 1.20 mg/dL   Total Bilirubin 0.5 0.2 - 1.2 mg/dL     Alkaline Phosphatase 58 39 - 117 U/L   AST 13 0 - 37 U/L   ALT 16 0 - 35 U/L   Total Protein 7.2 6.0 - 8.3 g/dL   Albumin 4.3 3.5 - 5.2 g/dL   Calcium 9.2 8.4 - 10.5 mg/dL   GFR 82.29 >60.00 mL/min  CBC with Differential/Platelet     Status: None   Collection Time: 02/13/18  2:14 PM  Result Value Ref Range   WBC 6.4 4.0 - 10.5 K/uL   RBC 4.47 3.87 - 5.11 Mil/uL   Hemoglobin 12.9 12.0 - 15.0 g/dL   HCT 37.8 36.0 - 46.0 %   MCV 84.5 78.0 - 100.0 fl   MCHC 34.2 30.0 - 36.0 g/dL   RDW 13.8 11.5 - 15.5 %   Platelets 306.0 150.0 - 400.0 K/uL   Neutrophils Relative % 62.2 43.0 - 77.0 %   Lymphocytes Relative 26.6 12.0 - 46.0 %   Monocytes Relative 5.5 3.0 - 12.0 %   Eosinophils Relative 5.0 0.0 - 5.0 %   Basophils Relative 0.7 0.0 - 3.0 %   Neutro Abs 4.0 1.4 - 7.7 K/uL   Lymphs Abs 1.7 0.7 - 4.0 K/uL   Monocytes Absolute 0.4 0.1 - 1.0 K/uL   Eosinophils Absolute 0.3 0.0 - 0.7 K/uL   Basophils Absolute 0.0 0.0 - 0.1 K/uL  Urinalysis, Routine w reflex microscopic     Status: None   Collection Time: 02/13/18  2:14 PM  Result Value Ref Range   Specific Gravity, UA 1.023 1.005 - 1.030   pH, UA 6.5 5.0 - 7.5   Color, UA Yellow Yellow   Appearance Ur Clear Clear   Leukocytes, UA Negative Negative   Protein, UA Negative Negative/Trace   Glucose, UA Negative Negative   Ketones, UA Negative Negative   RBC, UA Negative Negative   Bilirubin, UA Negative Negative   Urobilinogen, Ur 1.0 0.2 - 1.0 mg/dL   Nitrite, UA Negative Negative   Microscopic Examination Comment     Comment: Microscopic not indicated and not performed.  Vitamin D (25 hydroxy)     Status: Abnormal   Collection Time: 02/13/18  2:14 PM  Result Value Ref Range   VITD 20.75 (L) 30.00 - 100.00 ng/mL   Objective  Body mass index is 39.34 kg/m. Wt Readings from Last 3 Encounters:  04/04/18 229 lb 3.2 oz (104 kg)  02/13/18 228 lb 9.6 oz (103.7 kg)  05/02/17 240 lb 6.4 oz (109 kg)   Temp Readings from Last 3  Encounters:  04/04/18 98.7 F (37.1 C) (Oral)  02/13/18 98.5 F (36.9 C) (Oral)   BP Readings from Last 3 Encounters:  04/04/18 116/70  02/13/18 120/90  05/02/17 (!) 144/100   Pulse Readings from Last 3 Encounters:  04/04/18 (!) 59  02/13/18 (!) 54  05/02/17 65    Physical Exam  Constitutional: She is oriented to person, place, and time. Vital signs are normal. She appears well-developed and well-nourished. She is cooperative.  HENT:  Head: Normocephalic and atraumatic.  Mouth/Throat: Oropharynx is clear and moist and mucous membranes are normal.  Eyes: Pupils are equal, round, and reactive to light. Conjunctivae are normal.  Cardiovascular: Normal rate, regular rhythm and normal heart sounds.  Pulmonary/Chest: Effort normal and breath sounds normal.  Musculoskeletal:       Right hip: She exhibits no tenderness.       Left hip: She exhibits no tenderness.       Arms: Neurological: She is alert and oriented to person, place, and time. Gait normal.  Skin: Skin is warm, dry and intact.  Psychiatric: She has a normal mood and affect. Her speech is normal and behavior is normal. Judgment and thought content normal. Cognition and memory are normal.  Nursing note and vitals reviewed.   Assessment   1. htn/hld 2. Depression/insomnia  3. Vit D def  4. Obesity bmi 39.34  5. Left buttock rad to leg pain ? 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 HM Plan   1. Cont norvasc 2.5 mg qd  Will need lipid check in future ordered  2. Inc zoloft to 50 mg qd phq 9 score 12 today from 17 last visit 02/13/18  She declines to take supplement sleep see hpi 3. Resent D3 weekly x 6 months  4. rec healthy diet choices and exercise  5. Hold referral SM/ortho for now prev did not want to do PT  Disc otc tylenol, nsaids sparingly  6.  Flu shot had 2017  Tdap 07/11/17 04/16/17 Shirlee Limerick neg pap neg HPV [facility]  03/05/18 mammogram  neg  Pending dermatology appt 04/22/18  Declines STD check, MMR and hep B  Will need lipid check in future ordered for future    Provider: Dr. Olivia Mackie McLean-Scocuzza-[facility]

## 2018-04-04 NOTE — Patient Instructions (Signed)
Try to lose 8-9 pounds in the next 3-4 months  Increase zoloft to 50 mg or 2 pills of 25 mg   Exercising to Lose Weight Exercising can help you to lose weight. In order to lose weight through exercise, you need to do vigorous-intensity exercise. You can tell that you are exercising with vigorous intensity if you are breathing very hard and fast and cannot hold a conversation while exercising. Moderate-intensity exercise helps to maintain your current weight. You can tell that you are exercising at a moderate level if you have a higher heart rate and faster breathing, but you are still able to hold a conversation. How often should I exercise? Choose an activity that you enjoy and set realistic goals. Your health care provider can help you to make an activity plan that works for you. Exercise regularly as directed by your health care provider. This may include:  Doing resistance training twice each week, such as: ? Push-ups. ? Sit-ups. ? Lifting weights. ? Using resistance bands.  Doing a given intensity of exercise for a given amount of time. Choose from these options: ? 150 minutes of moderate-intensity exercise every week. ? 75 minutes of vigorous-intensity exercise every week. ? A mix of moderate-intensity and vigorous-intensity exercise every week.  Children, pregnant women, people who are out of shape, people who are overweight, and older adults may need to consult a health care provider for individual recommendations. If you have any sort of medical condition, be sure to consult your health care provider before starting a new exercise program. What are some activities that can help me to lose weight?  Walking at a rate of at least 4.5 miles an hour.  Jogging or running at a rate of 5 miles per hour.  Biking at a rate of at least 10 miles per hour.  Lap swimming.  Roller-skating or in-line skating.  Cross-country skiing.  Vigorous competitive sports, such as football,  basketball, and soccer.  Jumping rope.  Aerobic dancing. How can I be more active in my day-to-day activities?  Use the stairs instead of the elevator.  Take a walk during your lunch break.  If you drive, park your car farther away from work or school.  If you take public transportation, get off one stop early and walk the rest of the way.  Make all of your phone calls while standing up and walking around.  Get up, stretch, and walk around every 30 minutes throughout the day. What guidelines should I follow while exercising?  Do not exercise so much that you hurt yourself, feel dizzy, or get very short of breath.  Consult your health care provider prior to starting a new exercise program.  Wear comfortable clothes and shoes with good support.  Drink plenty of water while you exercise to prevent dehydration or heat stroke. Body water is lost during exercise and must be replaced.  Work out until you breathe faster and your heart beats faster. This information is not intended to replace advice given to you by your health care provider. Make sure you discuss any questions you have with your health care provider. Document Released: 09/15/2010 Document Revised: 01/19/2016 Document Reviewed: 01/14/2014 Elsevier Interactive Patient Education  2018 [employer]  Vitamin D Deficiency Vitamin D deficiency is when your body does not have enough vitamin D. Vitamin D is important to your body for many reasons:  It helps the body to absorb two important minerals, called calcium and phosphorus.  It plays a role  in bone health.  It may help to prevent some diseases, such as diabetes and multiple sclerosis.  It plays a role in muscle function, including heart function.  You can get vitamin D by:  Eating foods that naturally contain vitamin D.  Eating or drinking milk or other dairy products that have vitamin D added to them.  Taking a vitamin D supplement or a multivitamin  supplement that contains vitamin D.  Being in the sun. Your body naturally makes vitamin D when your skin is exposed to sunlight. Your body changes the sunlight into a form of the vitamin that the body can use.  If vitamin D deficiency is severe, it can cause a condition in which your bones become soft. In adults, this condition is called osteomalacia. In children, this condition is called rickets. What are the causes? Vitamin D deficiency may be caused by:  Not eating enough foods that contain vitamin D.  Not getting enough sun exposure.  Having certain digestive system diseases that make it difficult for your body to absorb vitamin D. These diseases include Crohn disease, chronic pancreatitis, and cystic fibrosis.  Having a surgery in which a part of the stomach or a part of the small intestine is removed.  Being obese.  Having chronic kidney disease or liver disease.  What increases the risk? This condition is more likely to develop in:  Older people.  People who do not spend much time outdoors.  People who live in a long-term care facility.  People who have had broken bones.  People with weak or thin bones (osteoporosis).  People who have a disease or condition that changes how the body absorbs vitamin D.  People who have dark skin.  People who take certain medicines, such as steroid medicines or certain seizure medicines.  People who are overweight or obese.  What are the signs or symptoms? In mild cases of vitamin D deficiency, there may not be any symptoms. If the condition is severe, symptoms may include:  Bone pain.  Muscle pain.  Falling often.  Broken bones caused by a minor injury.  How is this diagnosed? This condition is usually diagnosed with a blood test. How is this treated? Treatment for this condition may depend on what caused the condition. Treatment options include:  Taking vitamin D supplements.  Taking a calcium supplement. Your health  care provider will suggest what dose is best for you.  Follow these instructions at home:  Take medicines and supplements only as told by your health care provider.  Eat foods that contain vitamin D. Choices include: ? Fortified dairy products, cereals, or juices. Fortified means that vitamin D has been added to the food. Check the label on the package to be sure. ? Fatty fish, such as salmon or trout. ? Eggs. ? Oysters.  Do not use a tanning bed.  Maintain a healthy weight. Lose weight, if needed.  Keep all follow-up visits as told by your health care provider. This is important. Contact a health care provider if:  Your symptoms do not go away.  You feel like throwing up (nausea) or you throw up (vomit).  You have fewer bowel movements than usual or it is difficult for you to have a bowel movement (constipation). This information is not intended to replace advice given to you by your health care provider. Make sure you discuss any questions you have with your health care provider. Document Released: 11/05/2011 Document Revised: 01/25/2016 Document Reviewed: 12/29/2014 Elsevier Interactive Patient  Education  2018 [employer] Inc.  

## 2018-04-04 NOTE — Progress Notes (Signed)
Pre visit review using our clinic review tool, if applicable. No additional management support is needed unless otherwise documented below in the visit note. 

## 2018-04-06 ENCOUNTER — Other Ambulatory Visit: Payer: Self-pay | Admitting: Internal Medicine

## 2018-04-06 ENCOUNTER — Encounter: Payer: Self-pay | Admitting: Internal Medicine

## 2018-04-06 DIAGNOSIS — E785 Hyperlipidemia, unspecified: Secondary | ICD-10-CM

## 2018-04-06 DIAGNOSIS — M1990 Unspecified osteoarthritis, unspecified site: Secondary | ICD-10-CM | POA: Insufficient documentation

## 2018-04-08 NOTE — Progress Notes (Signed)
mychart message has been sent to patient to inform them. 

## 2018-08-06 ENCOUNTER — Ambulatory Visit: Payer: BC Managed Care – PPO | Admitting: Internal Medicine

## 2018-08-06 DIAGNOSIS — Z0289 Encounter for other administrative examinations: Secondary | ICD-10-CM

## 2018-12-02 IMAGING — DX DG LUMBAR SPINE COMPLETE 4+V
5 series · 5 of 5 positions shown · non-contrast
Comparison: None.

CLINICAL DATA: Low back pain radiating into the left leg for
several months

EXAM:
LUMBAR SPINE - COMPLETE 4+ VIEW

[lumbar spine ap]
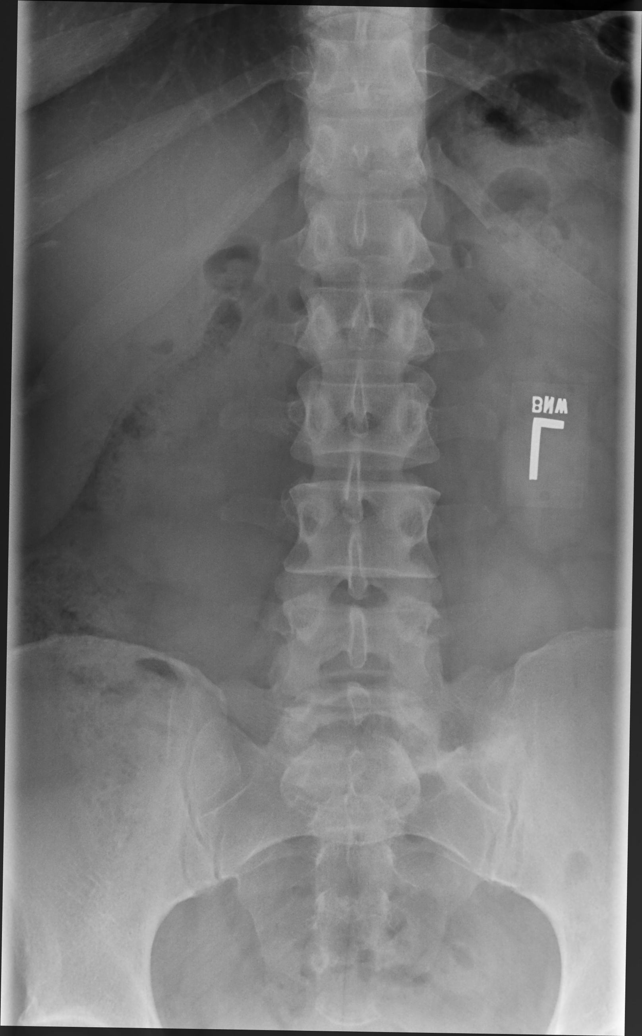

[lumbar spine obl (oblique) (1 of 2)]
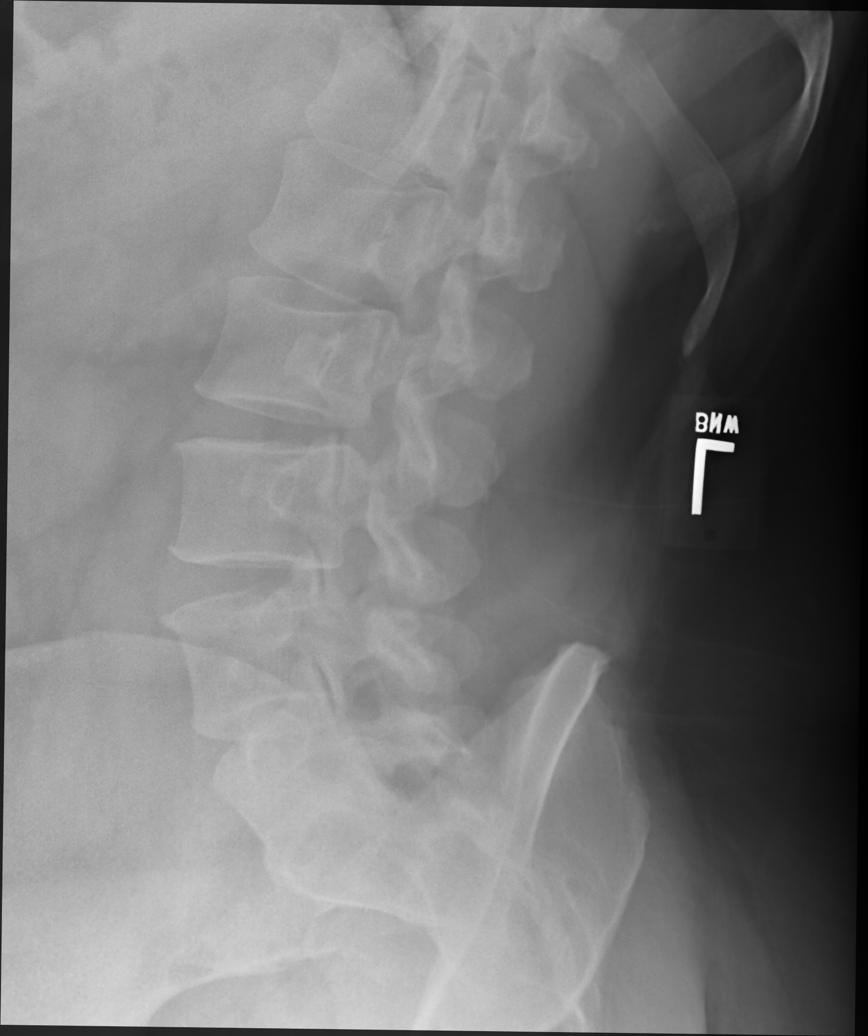

[lumbar spine obl (oblique) (2 of 2)]
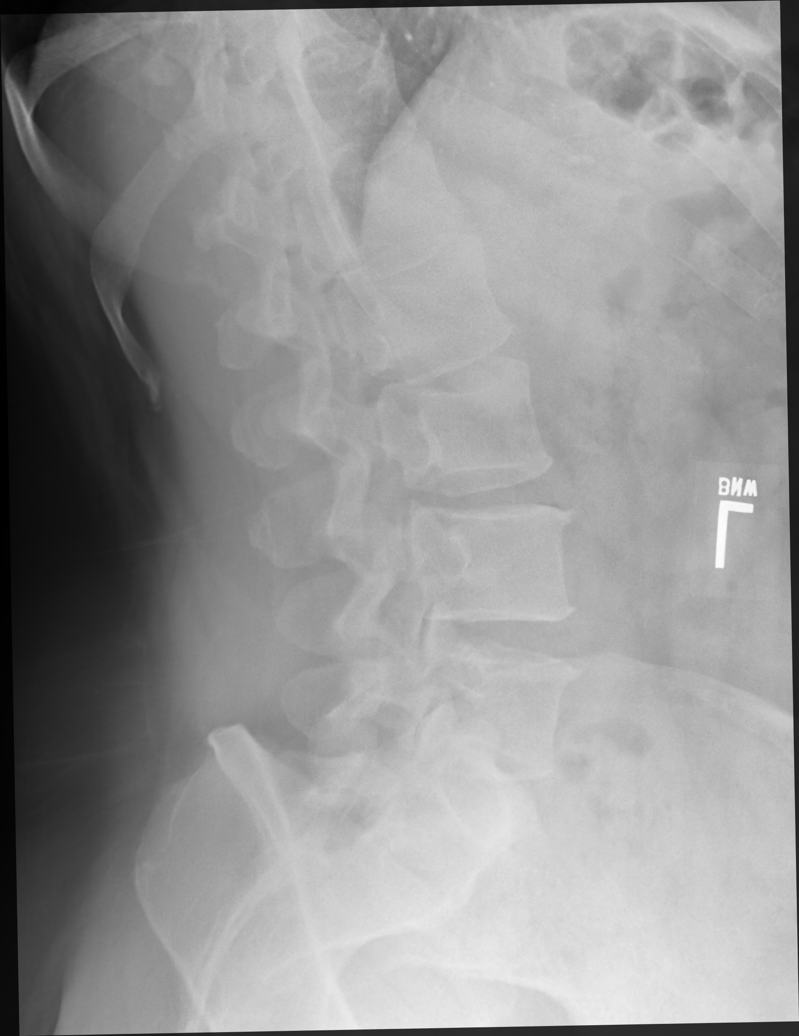

[lumbar spine lat]
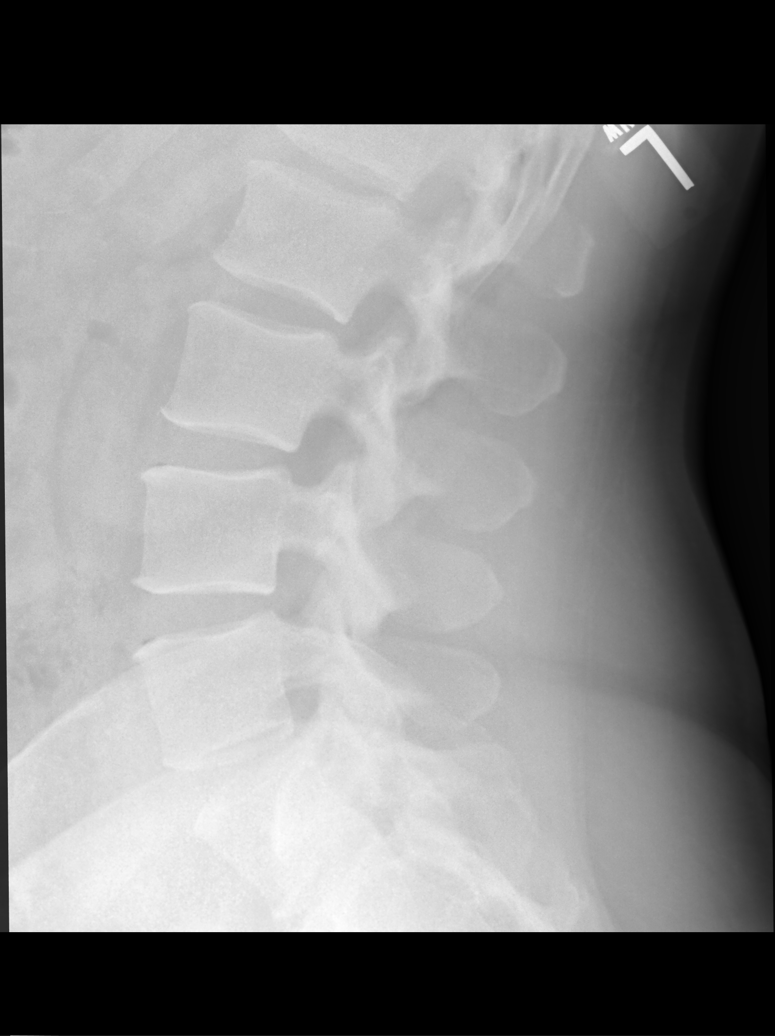

[lumbar spot lat]
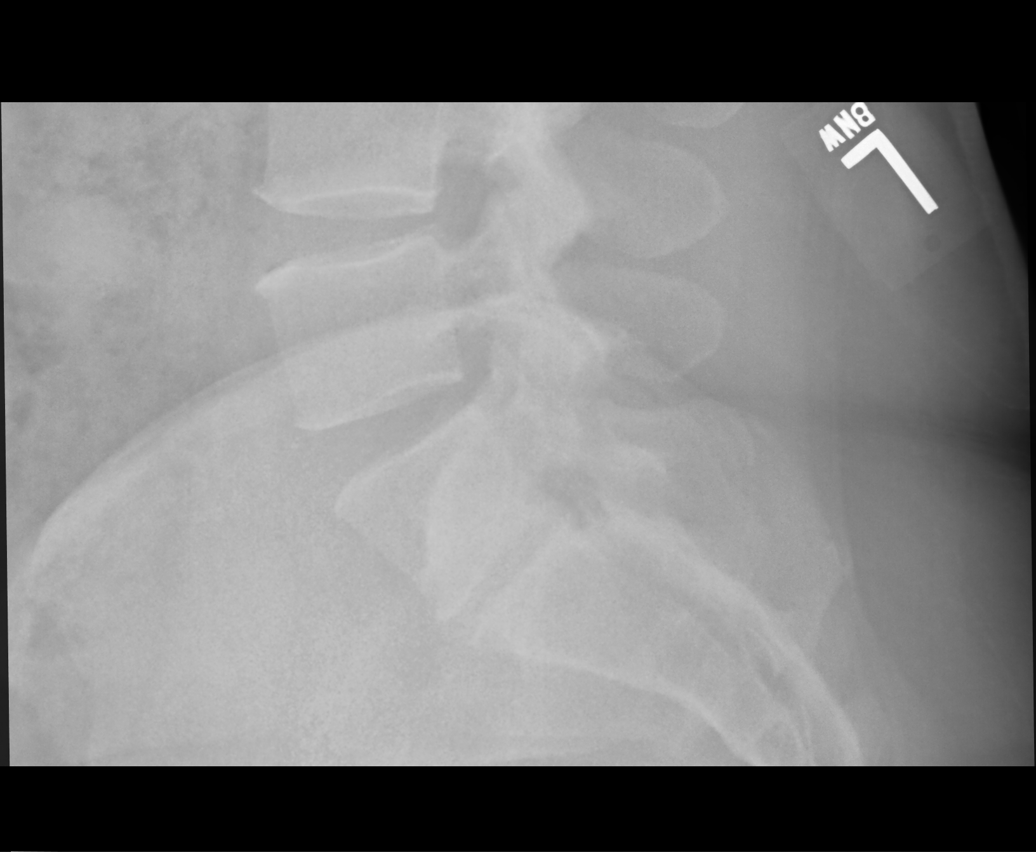

[5 of 5 positions shown; findings below may reference images not displayed]

FINDINGS: Vertebral body height is well maintained. No pars defects are noted.
Mild osteophytic changes are seen. Transitional segment is noted. No
soft tissue abnormality is seen.
IMPRESSION: No acute abnormality noted.

## 2019-07-22 ENCOUNTER — Other Ambulatory Visit: Payer: Self-pay

## 2019-07-22 DIAGNOSIS — Z20822 Contact with and (suspected) exposure to covid-19: Secondary | ICD-10-CM

## 2019-07-24 LAB — NOVEL CORONAVIRUS, NAA: SARS-CoV-2, NAA: DETECTED — AB

## 2019-07-25 ENCOUNTER — Telehealth: Payer: Self-pay | Admitting: Critical Care Medicine

## 2019-07-25 NOTE — Telephone Encounter (Signed)
I called this patient who is Covid positive from a November 25 test.  She currently is having no symptoms except loss of smell.  Therefore she is not a candidate for monoclonal antibody infusion

## 2020-02-24 ENCOUNTER — Encounter: Payer: Self-pay | Admitting: Nurse Practitioner

## 2020-02-24 ENCOUNTER — Other Ambulatory Visit: Payer: Self-pay

## 2020-02-24 ENCOUNTER — Ambulatory Visit (INDEPENDENT_AMBULATORY_CARE_PROVIDER_SITE_OTHER): Payer: BC Managed Care – PPO | Admitting: Nurse Practitioner

## 2020-02-24 VITALS — BP 118/80 | HR 58 | Temp 97.7°F | Ht 64.0 in | Wt 204.0 lb

## 2020-02-24 DIAGNOSIS — R300 Dysuria: Secondary | ICD-10-CM

## 2020-02-24 LAB — POCT URINALYSIS DIPSTICK
Bilirubin, UA: NEGATIVE
Glucose, UA: NEGATIVE
Ketones, UA: NEGATIVE
Leukocytes, UA: NEGATIVE
Nitrite, UA: NEGATIVE
Protein, UA: POSITIVE — AB
Spec Grav, UA: >=1.03 — AB (ref 1.010–1.025)
Urobilinogen, UA: NEGATIVE E.U./dL — AB
pH, UA: 5.5 (ref 5.0–8.0)

## 2020-02-24 LAB — POCT URINE PREGNANCY: Preg Test, Ur: NEGATIVE

## 2020-02-24 MED ORDER — NITROFURANTOIN MONOHYD MACRO 100 MG PO CAPS: 100.0000 mg | ORAL_CAPSULE | Freq: Two times a day (BID) | ORAL | 0 refills | Status: DC

## 2020-02-24 NOTE — Progress Notes (Signed)
Established Patient Office Visit  Subjective:  Patient ID: Emily Huffman, female    DOB: 07-11-1976  Age: 44 y.o. MRN: [id number]  CC:  Chief Complaint  Patient presents with  . Acute Visit    UTI    HPI Emily Huffman is a 44 year old who presents for urinary frequency and mild burning with urination. More symptomatic in the afternoon and evening. Now, she has a pressure sensation and feels like her muscles cannot relax after urination. No flank pain, hematuria, fever/chill or N/V. She has never had a UTI in the past. No vaginal discharge or pain with intercourse. She has been in the river tubing and [place].   Past Medical History:  Diagnosis Date  . Depression   . Hyperlipidemia   . Hypertension     No past surgical history on file.  Family History  Problem Relation Age of Onset  . Stroke Father   . Diabetes Maternal Grandmother   . Heart disease Maternal Grandmother   . Stroke Paternal Grandmother   . Asthma Daughter   . Birth defects Son   . Intellectual disability Son   . Breast cancer Neg Hx     Social History   Socioeconomic History  . Marital status: Divorced    Spouse name: Not on file  . Number of children: Not on file  . Years of education: Not on file  . Highest education level: Not on file  Occupational History  . Not on file  Tobacco Use  . Smoking status: Never Smoker  . Smokeless tobacco: Never Used  Vaping Use  . Vaping Use: Never used  Substance and Sexual Activity  . Alcohol use: Yes    Comment: occas  . Drug use: No  . Sexual activity: Yes    Birth control/protection: Condom  Other Topics Concern  . Not on file  Social History Narrative   Divorced 3 kids 2 girls 1 boy ages 20/18/8 as of 02/13/18    TA K-1st grade    Wears selt belt   Safe in relationship    Never smoker    Social Determinants of Health   Financial Resource Strain:   . Difficulty of Paying Living Expenses:   Food Insecurity:   . Worried About  Programme researcher, broadcasting/film/video in the Last Year:   . Barista in the Last Year:   Transportation Needs:   . Freight forwarder (Medical):   Marland Kitchen Lack of Transportation (Non-Medical):   Physical Activity:   . Days of Exercise per Week:   . Minutes of Exercise per Session:   Stress:   . Feeling of Stress :   Social Connections:   . Frequency of Communication with Friends and Family:   . Frequency of Social Gatherings with Friends and Family:   . Attends Religious Services:   . Active Member of Clubs or Organizations:   . Attends Banker Meetings:   Marland Kitchen Marital Status:   Intimate Partner Violence:   . Fear of Current or Ex-Partner:   . Emotionally Abused:   Marland Kitchen Physically Abused:   . Sexually Abused:     Outpatient Medications Prior to Visit  Medication Sig Dispense Refill  . amLODipine (NORVASC) 2.5 MG tablet Take 1 tablet (2.5 mg total) by mouth daily. 90 tablet 1  . Cholecalciferol 50000 units capsule Take 1 capsule (50,000 Units total) by mouth once a week. 13 capsule 1  . sertraline (ZOLOFT) 50 MG  tablet Take 1 tablet (50 mg total) by mouth daily. 90 tablet 1   No facility-administered medications prior to visit.    No Known Allergies   Review of Systems Pertinent positives noted in history of present illness otherwise negative.   Objective:    Physical Exam Vitals reviewed.  Constitutional:      Appearance: Normal appearance.  HENT:     Head: Normocephalic.  Eyes:     Conjunctiva/sclera: Conjunctivae normal.     Pupils: Pupils are equal, round, and reactive to light.  Cardiovascular:     Rate and Rhythm: Normal rate and regular rhythm.     Pulses: Normal pulses.     Heart sounds: Normal heart sounds.  Pulmonary:     Effort: Pulmonary effort is normal.     Breath sounds: Normal breath sounds.  Abdominal:     Palpations: Abdomen is soft.     Tenderness: There is no abdominal tenderness.  Musculoskeletal:        General: Normal range of motion.      Cervical back: Normal range of motion and neck supple.  Skin:    General: Skin is warm and dry.  Neurological:     General: No focal deficit present.     Mental Status: She is alert and oriented to person, place, and time.  Psychiatric:        Mood and Affect: Mood normal.        Behavior: Behavior normal.     BP 118/80 (BP Location: Left Arm, Patient Position: Sitting, Cuff Size: Normal)   Pulse (!) 58   Temp 97.7 F (36.5 C) (Skin)   Ht 5\' 4"  (1.626 m)   Wt 204 lb (92.5 kg)   SpO2 98%   BMI 35.02 kg/m  Wt Readings from Last 3 Encounters:  02/24/20 204 lb (92.5 kg)  04/04/18 229 lb 3.2 oz (104 kg)  02/13/18 228 lb 9.6 oz (103.7 kg)     Health Maintenance Due  Topic Date Due  . Hepatitis C Screening  Never done  . COVID-19 Vaccine (1) Never done  . HIV Screening  Never done  . PAP SMEAR-Modifier  04/16/2020    There are no preventive care reminders to display for this patient.  Lab Results  Component Value Date   TSH 2.050 04/18/2017   Lab Results  Component Value Date   WBC 6.4 02/13/2018   HGB 12.9 02/13/2018   HCT 37.8 02/13/2018   MCV 84.5 02/13/2018   PLT 306.0 02/13/2018   Lab Results  Component Value Date   NA 141 02/13/2018   K 4.4 02/13/2018   CO2 29 02/13/2018   GLUCOSE 93 02/13/2018   BUN 19 02/13/2018   CREATININE 0.81 02/13/2018   BILITOT 0.5 02/13/2018   ALKPHOS 58 02/13/2018   AST 13 02/13/2018   ALT 16 02/13/2018   PROT 7.2 02/13/2018   ALBUMIN 4.3 02/13/2018   CALCIUM 9.2 02/13/2018   GFR 82.29 02/13/2018   Lab Results  Component Value Date   CHOL 206 (H) 04/18/2017   Lab Results  Component Value Date   HDL 52 04/18/2017   Lab Results  Component Value Date   LDLCALC 131 (H) 04/18/2017   Lab Results  Component Value Date   TRIG 115 04/18/2017   Lab Results  Component Value Date   CHOLHDL 4.0 04/18/2017   Lab Results  Component Value Date   HGBA1C 5.5 04/18/2017      Assessment & Plan:   Problem  List Items  Addressed This Visit    None    Visit Diagnoses    Dysuria    -  Primary   Relevant Orders   POCT Urinalysis Dipstick (Completed)   Urine Culture   Urine Microscopic Only (Completed)   POCT urine pregnancy (Completed)      Meds ordered this encounter  Medications  . nitrofurantoin, macrocrystal-monohydrate, (MACROBID) 100 MG capsule    Sig: Take 1 capsule (100 mg total) by mouth 2 (two) times daily. Take with food.    Dispense:  10 capsule    Refill:  0    Order Specific Question:   Supervising Provider    Answer:   [facility] [[id number]]   We have sent your urine off for a culture. This will determine whether there is an infection and what type.   Since you have symptoms of a urinary tract infection- I recommended treatment with nitrofurantoin (MACROBID).  While we wait for the culture to return. I will notify you if we have to change the treatment.   Drink plenty of fluids, may try cranberry juice, monitor for fevers, chills, nausea, vomiting, abdominal pain, or back pain.  Notify us if this occurs.  Follow-up: Return in about 2 months (around 04/25/2020), or if symptoms worsen or fail to improve.   This visit occurred during the SARS-CoV-2 public health emergency.  Safety protocols were in place, including screening questions prior to the visit, additional usage of staff PPE, and extensive cleaning of exam room while observing appropriate contact time as indicated for disinfecting solutions.    Amedeo Kinsman, NP

## 2020-02-24 NOTE — Patient Instructions (Addendum)
We have sent your urine off for a culture. This will determine whether there is an infection and what type.   Since you have symptoms of a urinary tract infection- I recommended treatment with nitrofurantoin (MACROBID).  While we wait for the culture to return. I will notify you if we have to change the treatment.   Drink plenty of fluids, may try cranberry juice, monitor for fevers, chills, nausea, vomiting, abdominal pain, or back pain.  Notify us if this occurs.   Dysuria Dysuria is pain or discomfort while urinating. The pain or discomfort may be felt in the part of your body that drains urine from the bladder (urethra) or in the surrounding tissue of the genitals. The pain may also be felt in the groin area, lower abdomen, or lower back. You may have to urinate frequently or have the sudden feeling that you have to urinate (urgency). Dysuria can affect both men and women, but it is more common in women. Dysuria can be caused by many different things, including:  Urinary tract infection.  Kidney stones or bladder stones.  Certain sexually transmitted infections (STIs), such as chlamydia.  Dehydration.  Inflammation of the tissues of the vagina.  Use of certain medicines.  Use of certain soaps or scented products that cause irritation. Follow these instructions at home: General instructions  Watch your condition for any changes.  Urinate often. Avoid holding urine for long periods of time.  After a bowel movement or urination, women should cleanse from front to back, using each tissue only once.  Urinate after sexual intercourse.  Keep all follow-up visits as told by your health care provider. This is important.  If you had any tests done to find the cause of dysuria, it is up to you to get your test results. Ask your health care provider, or the department that is doing the test, when your results will be ready. Eating and drinking   Drink enough fluid to keep your urine  pale yellow.  Avoid caffeine, tea, and alcohol. They can irritate the bladder and make dysuria worse. In men, alcohol may irritate the prostate. Medicines  Take over-the-counter and prescription medicines only as told by your health care provider.  If you were prescribed an antibiotic medicine, take it as told by your health care provider. Do not stop taking the antibiotic even if you start to feel better. Contact a health care provider if:  You have a fever.  You develop pain in your back or sides.  You have nausea or vomiting.  You have blood in your urine.  You are not urinating as often as you usually do. Get help right away if:  Your pain is severe and not relieved with medicines.  You cannot eat or drink without vomiting.  You are confused.  You have a rapid heartbeat while at rest.  You have shaking or chills.  You feel extremely weak. Summary  Dysuria is pain or discomfort while urinating. Many different conditions can lead to dysuria.  If you have dysuria, you may have to urinate frequently or have the sudden feeling that you have to urinate (urgency).  Watch your condition for any changes. Keep all follow-up visits as told by your health care provider.  Make sure that you urinate often and drink enough fluid to keep your urine pale yellow. This information is not intended to replace advice given to you by your health care provider. Make sure you discuss any questions you have with your  health care provider. Document Revised: 07/26/2017 Document Reviewed: 05/30/2017 Elsevier Patient Education  2020 [employer].

## 2020-02-26 ENCOUNTER — Encounter: Payer: Self-pay | Admitting: Nurse Practitioner

## 2020-02-26 LAB — URINALYSIS, MICROSCOPIC ONLY: WBC, UA: >=60 /HPF — AB (ref 0–5)

## 2020-02-26 LAB — URINE CULTURE
MICRO NUMBER:: 10652546
SPECIMEN QUALITY:: ADEQUATE

## 2020-05-11 ENCOUNTER — Ambulatory Visit: Payer: BC Managed Care – PPO | Admitting: Internal Medicine

## 2020-05-20 ENCOUNTER — Telehealth: Payer: BC Managed Care – PPO | Admitting: Internal Medicine

## 2021-03-01 ENCOUNTER — Encounter: Payer: Self-pay | Admitting: Internal Medicine

## 2021-03-01 ENCOUNTER — Ambulatory Visit (INDEPENDENT_AMBULATORY_CARE_PROVIDER_SITE_OTHER): Payer: BC Managed Care – PPO | Admitting: Internal Medicine

## 2021-03-01 ENCOUNTER — Other Ambulatory Visit: Payer: Self-pay

## 2021-03-01 ENCOUNTER — Other Ambulatory Visit (HOSPITAL_COMMUNITY)
Admission: RE | Admit: 2021-03-01 | Discharge: 2021-03-01 | Disposition: A | Discharge status: Home / Self Care | Payer: BC Managed Care – PPO | Source: Ambulatory Visit | Attending: Internal Medicine | Admitting: Internal Medicine

## 2021-03-01 VITALS — BP 136/88 | HR 68 | Temp 98.1°F | Ht 64.96 in | Wt 204.2 lb

## 2021-03-01 DIAGNOSIS — N760 Acute vaginitis: Secondary | ICD-10-CM | POA: Insufficient documentation

## 2021-03-01 DIAGNOSIS — Z124 Encounter for screening for malignant neoplasm of cervix: Secondary | ICD-10-CM

## 2021-03-01 DIAGNOSIS — N898 Other specified noninflammatory disorders of vagina: Secondary | ICD-10-CM

## 2021-03-01 DIAGNOSIS — E538 Deficiency of other specified B group vitamins: Secondary | ICD-10-CM | POA: Diagnosis not present

## 2021-03-01 DIAGNOSIS — Z1389 Encounter for screening for other disorder: Secondary | ICD-10-CM

## 2021-03-01 DIAGNOSIS — N926 Irregular menstruation, unspecified: Secondary | ICD-10-CM

## 2021-03-01 DIAGNOSIS — Z0000 Encounter for general adult medical examination without abnormal findings: Secondary | ICD-10-CM | POA: Diagnosis not present

## 2021-03-01 DIAGNOSIS — F4321 Adjustment disorder with depressed mood: Secondary | ICD-10-CM | POA: Diagnosis not present

## 2021-03-01 DIAGNOSIS — Z1211 Encounter for screening for malignant neoplasm of colon: Secondary | ICD-10-CM

## 2021-03-01 DIAGNOSIS — Z1231 Encounter for screening mammogram for malignant neoplasm of breast: Secondary | ICD-10-CM | POA: Diagnosis not present

## 2021-03-01 DIAGNOSIS — Z13220 Encounter for screening for lipoid disorders: Secondary | ICD-10-CM | POA: Diagnosis not present

## 2021-03-01 DIAGNOSIS — E559 Vitamin D deficiency, unspecified: Secondary | ICD-10-CM

## 2021-03-01 DIAGNOSIS — Z1329 Encounter for screening for other suspected endocrine disorder: Secondary | ICD-10-CM

## 2021-03-01 LAB — CBC WITH DIFFERENTIAL/PLATELET
Basophils Absolute: 0 10*3/uL (ref 0.0–0.1)
Basophils Relative: 0.7 % (ref 0.0–3.0)
Eosinophils Absolute: 0.2 10*3/uL (ref 0.0–0.7)
Eosinophils Relative: 2.7 % (ref 0.0–5.0)
HCT: 42.3 % (ref 36.0–46.0)
Hemoglobin: 14.4 g/dL (ref 12.0–15.0)
Lymphocytes Relative: 25.2 % (ref 12.0–46.0)
Lymphs Abs: 1.4 10*3/uL (ref 0.7–4.0)
MCHC: 34.2 g/dL (ref 30.0–36.0)
MCV: 84.3 fl (ref 78.0–100.0)
Monocytes Absolute: 0.3 10*3/uL (ref 0.1–1.0)
Monocytes Relative: 5.2 % (ref 3.0–12.0)
Neutro Abs: 3.7 10*3/uL (ref 1.4–7.7)
Neutrophils Relative %: 66.2 % (ref 43.0–77.0)
Platelets: 306 10*3/uL (ref 150.0–400.0)
RBC: 5.01 Mil/uL (ref 3.87–5.11)
RDW: 13.5 % (ref 11.5–15.5)
WBC: 5.6 10*3/uL (ref 4.0–10.5)

## 2021-03-01 LAB — LIPID PANEL
Cholesterol: 209 mg/dL — ABNORMAL HIGH (ref 0–200)
HDL: 58.7 mg/dL (ref >39.00)
LDL Cholesterol: 130 mg/dL — ABNORMAL HIGH (ref 0–99)
NonHDL: 150.21
Total CHOL/HDL Ratio: 4
Triglycerides: 99 mg/dL (ref 0.0–149.0)
VLDL: 19.8 mg/dL (ref 0.0–40.0)

## 2021-03-01 LAB — COMPREHENSIVE METABOLIC PANEL
ALT: 14 U/L (ref 0–35)
AST: 14 U/L (ref 0–37)
Albumin: 4.7 g/dL (ref 3.5–5.2)
Alkaline Phosphatase: 61 U/L (ref 39–117)
BUN: 24 mg/dL — ABNORMAL HIGH (ref 6–23)
CO2: 27 mEq/L (ref 19–32)
Calcium: 9.8 mg/dL (ref 8.4–10.5)
Chloride: 104 mEq/L (ref 96–112)
Creatinine, Ser: 0.88 mg/dL (ref 0.40–1.20)
GFR: 79.41 mL/min (ref >60.00)
Glucose, Bld: 92 mg/dL (ref 70–99)
Potassium: 4.6 mEq/L (ref 3.5–5.1)
Sodium: 139 mEq/L (ref 135–145)
Total Bilirubin: 0.6 mg/dL (ref 0.2–1.2)
Total Protein: 7.6 g/dL (ref 6.0–8.3)

## 2021-03-01 LAB — FOLLICLE STIMULATING HORMONE: FSH: 5.7 m[IU]/mL

## 2021-03-01 LAB — VITAMIN B12: Vitamin B-12: 288 pg/mL (ref 211–911)

## 2021-03-01 LAB — VITAMIN D 25 HYDROXY (VIT D DEFICIENCY, FRACTURES): VITD: 25.02 ng/mL — ABNORMAL LOW (ref 30.00–100.00)

## 2021-03-01 LAB — TSH: TSH: 1.6 u[IU]/mL (ref 0.35–5.50)

## 2021-03-01 MED ORDER — CITALOPRAM HYDROBROMIDE 10 MG PO TABS: 10.0000 mg | ORAL_TABLET | Freq: Every day | ORAL | 3 refills | Status: DC

## 2021-03-01 NOTE — Progress Notes (Signed)
Chief Complaint  Patient presents with  . Annual Exam   Annual  1. Depression cousin died 1 year ago and ex husband recently dx 1-2 months to live with colon cancer has fatigue lack of sleep and lack of motivation  2. C/o vaginal discharge and irregular cycles declines TVUS and pelvic US today 3. 3/10 neck pain   Review of Systems  Constitutional:  Positive for malaise/fatigue. Negative for weight loss.  HENT:  Negative for hearing loss.   Eyes:  Negative for blurred vision.  Respiratory:  Negative for shortness of breath.   Cardiovascular:  Negative for chest pain.  Gastrointestinal:  Negative for abdominal pain.  Musculoskeletal:  Negative for falls and joint pain.  Skin:  Negative for rash.  Psychiatric/Behavioral:  Positive for depression. The patient has insomnia.   Past Medical History:  Diagnosis Date  . COVID-19    06/2019  . Depression   . Hyperlipidemia   . Hypertension    Past Surgical History:  Procedure Laterality Date  . WISDOM TOOTH EXTRACTION     Family History  Problem Relation Age of Onset  . Heart attack Mother        age 29  . Stroke Father   . Diabetes Maternal Grandmother   . Heart disease Maternal Grandmother   . Stroke Paternal Grandmother   . Asthma Daughter   . Birth defects Son   . Intellectual disability Son   . Ovarian cancer Cousin        died 31  . Breast cancer Neg Hx    Social History   Socioeconomic History  . Marital status: Divorced    Spouse name: Not on file  . Number of children: Not on file  . Years of education: Not on file  . Highest education level: Not on file  Occupational History  . Not on file  Tobacco Use  . Smoking status: Never  . Smokeless tobacco: Never  Vaping Use  . Vaping Use: Never used  Substance and Sexual Activity  . Alcohol use: Yes    Comment: occas  . Drug use: No  . Sexual activity: Yes    Birth control/protection: Condom  Other Topics Concern  . Not on file  Social History Narrative    Divorced 3 kids 2 girls 1 boy ages 20/18/8 as of 02/13/18    TA K-1st grade    Wears selt belt   Safe in relationship    Never smoker    Works Engineer, maintenance (IT) part time   Social Determinants of Radio broadcast assistant Strain: Not on [employer] Insecurity: Not on file  Transportation Needs: Not on file  Physical Activity: Not on file  Stress: Not on file  Social Connections: Not on file  Intimate Partner Violence: Not on file   Current Meds  Medication Sig  . citalopram (CELEXA) 10 MG tablet Take 1 tablet (10 mg total) by mouth daily.   No Known Allergies No results found for this or any previous visit (from the past 2160 hour(s)). Objective  Body mass index is 34.02 kg/m. Wt Readings from Last 3 Encounters:  03/01/21 204 lb 3.2 oz (92.6 kg)  02/24/20 204 lb (92.5 kg)  04/04/18 229 lb 3.2 oz (104 kg)   Temp Readings from Last 3 Encounters:  03/01/21 98.1 F (36.7 C) (Oral)  02/24/20 97.7 F (36.5 C) (Skin)  04/04/18 98.7 F (37.1 C) (Oral)   BP Readings from Last 3 Encounters:  03/01/21  136/88  02/24/20 118/80  04/04/18 116/70   Pulse Readings from Last 3 Encounters:  03/01/21 68  02/24/20 (!) 58  04/04/18 (!) 59    Physical Exam Vitals and nursing note reviewed.  Constitutional:      Appearance: Normal appearance. She is well-developed and well-groomed. She is obese.  HENT:     Head: Normocephalic and atraumatic.  Eyes:     Conjunctiva/sclera: Conjunctivae normal.     Pupils: Pupils are equal, round, and reactive to light.  Cardiovascular:     Rate and Rhythm: Normal rate and regular rhythm.     Heart sounds: Normal heart sounds. No murmur heard. Pulmonary:     Effort: Pulmonary effort is normal.     Breath sounds: Normal breath sounds.  Chest:  Breasts:    Breasts are symmetrical.     Right: Normal. No axillary adenopathy.     Left: Normal. No axillary adenopathy.  Abdominal:     General: Abdomen is flat. Bowel sounds are normal.   Genitourinary:    Vagina: Normal.     Cervix: Discharge and cervical bleeding present.     Uterus: Normal.      Adnexa: Right adnexa normal and left adnexa normal.  Lymphadenopathy:     Upper Body:     Right upper body: No axillary adenopathy.     Left upper body: No axillary adenopathy.  Skin:    General: Skin is warm and dry.  Neurological:     General: No focal deficit present.     Mental Status: She is alert and oriented to person, place, and time. Mental status is at baseline.     Gait: Gait normal.  Psychiatric:        Attention and Perception: Attention and perception normal.        Mood and Affect: Mood and affect normal.        Speech: Speech normal.        Behavior: Behavior normal. Behavior is cooperative.        Thought Content: Thought content normal.        Cognition and Memory: Cognition and memory normal.        Judgment: Judgment normal.    Assessment  Plan  Annual physical exam - Plan: Comprehensive metabolic panel, Lipid panel, CBC w/Diff, TSH, Urinalysis, Routine w reflex microscopic, VITAMIN D 25 Hydroxy (Vit-D Deficiency, Fractures), Vitamin B12 Flu shot declines  [facility] had 2/2 consider booste r Tdap 07/11/17 04/16/17 [facility] neg pap neg HPV [facility] pap today 03/05/18 mammogram neg ordered  Colonoscopy ordered Dermatology  Declines STD check, MMR and hep B  Fasting labs   Adjustment disorder with depressed mood - Plan: citalopram (CELEXA) 10 MG tablet Given # therapy   Routine cervical smear - Plan: Cytology - PAP( [place]) Irregular menstrual cycle - Plan: [place] Consider pelvis US and TVUS  Acute vaginitis - Plan: Cervicovaginal ancillary only( [place])    Provider: Dr. Olivia Mackie McLean-Scocuzza-Internal Medicine

## 2021-03-01 NOTE — Patient Instructions (Addendum)
Goal <130/<80  Omron blood pressure cuff   Celexa 10 mg in am with food   Citalopram Tablets What is this medication? CITALOPRAM (sye TAL oh pram) treats depression. It increases the amount of serotonin in the brain, a hormone that helps regulate mood. It belongs to agroup of medications called SSRIs. This medicine may be used for other purposes; ask your health care provider orpharmacist if you have questions. COMMON BRAND NAME(S): Celexa What should I tell my care team before I take this medication? They need to know if you have any of these conditions: Bipolar disorder or a family history of bipolar disorder Bleeding disorders Glaucoma Heart disease History of irregular heartbeat Kidney disease Liver disease Low levels of magnesium or potassium in the blood Receiving electroconvulsive therapy Seizures Suicidal thoughts, plans, or attempt; a previous suicide attempt by you or a family member Take medications that treat or prevent blood clots Thyroid disease An unusual or allergic reaction to citalopram, escitalopram, other medications, foods, dyes, or preservatives Pregnant or trying to become pregnant Breast-feeding How should I use this medication? Take this medication by mouth with a glass of water. Follow the directions on the prescription label. You can take it with or without food. Take your medication at regular intervals. Do not take your medication more often than directed. Do not stop taking this medication suddenly except upon the advice of your care team. Stopping this medication too quickly may cause serious sideeffects or your condition may worsen. A special MedGuide will be given to you by the pharmacist with eachprescription and refill. Be sure to read this information carefully each time. Talk to your care team about the use of this medication in children. Specialcare may be needed. Patients over 45 years old may have a stronger reaction and need a smaller  dose. Overdosage: If you think you have taken too much of this medicine contact apoison control center or emergency room at once. NOTE: This medicine is only for you. Do not share this medicine with others. What if I miss a dose? If you miss a dose, take it as soon as you can. If it is almost time for yournext dose, take only that dose. Do not take double or extra doses. What may interact with this medication? Do not take this medication with any of the following: Certain medications for fungal infections like fluconazole, itraconazole, ketoconazole, posaconazole, voriconazole Cisapride Dronedarone Escitalopram Linezolid MAOIs like Carbex, Eldepryl, Marplan, Nardil, and Parnate Methylene blue (injected into a vein) Pimozide Thioridazine This medication may also interact with the following: Alcohol Amphetamines Aspirin and aspirin-like medications Carbamazepine Certain medications for depression, anxiety, or psychotic disturbances Certain medications for infections like chloroquine, clarithromycin, erythromycin, furazolidone, isoniazid, pentamidine Certain medications for migraine headaches like almotriptan, eletriptan, frovatriptan, naratriptan, rizatriptan, sumatriptan, zolmitriptan Certain medications for sleep Certain medications that treat or prevent blood clots like dalteparin, enoxaparin, warfarin Cimetidine Diuretics Dofetilide Fentanyl Lithium Methadone Metoprolol NSAIDs, medications for pain and inflammation, like ibuprofen or naproxen Omeprazole Other medications that prolong the QT interval (cause an abnormal heart rhythm) Procarbazine Rasagiline Supplements like St. John's wort, kava kava, valerian Tramadol Tryptophan Ziprasidone This list may not describe all possible interactions. Give your health care provider a list of all the medicines, herbs, non-prescription drugs, or dietary supplements you use. Also tell them if you smoke, drink alcohol, or use  illegaldrugs. Some items may interact with your medicine. What should I watch for while using this medication? Tell your care team if your symptoms  do not get better or if they get worse. Visit your care team for regular checks on your progress. Because it may take several weeks to see the full effects of this medication, it is important tocontinue your treatment as prescribed. Watch for new or worsening thoughts of suicide or depression. This includes sudden changes in mood, behavior, or thoughts. These changes can happen at any time but are more common in the beginning of treatment or after a change in dose. Call your care team right away if you experience these thoughts orworsening depression. Manic episodes may happen in patients with bipolar disorder who take this medication. Watch for changes in feelings or behaviors such as feeling anxious, nervous, agitated, panicky, irritable, hostile, aggressive, impulsive, severely restless, overly excited and hyperactive, or trouble sleeping. These symptoms can happen at anytime but are more common in the beginning of treatment or after a change in dose. Call you care team right away if you notice any ofthese symptoms. You may get drowsy or dizzy. Do not drive, use machinery, or do anything that needs mental alertness until you know how this medication affects you. Do not stand or sit up quickly, especially if you are an older patient. This reduces the risk of dizzy or fainting spells. Alcohol may interfere with the effect ofthis medication. Avoid alcoholic drinks. Your mouth may get dry. Chewing sugarless gum or sucking hard candy, and drinking plenty of water may help. Contact your care team if the problem doesnot go away or is severe. What side effects may I notice from receiving this medication? Side effects that you should report to your care team as soon as possible: Allergic reactions-skin rash, itching, hives, swelling of the face, lips, tongue, or  throat Bleeding-bloody or black, tar-like stools, red or dark brown urine, vomiting blood or brown material that looks like coffee grounds, small, red or purple spots on skin, unusual bleeding or bruising Heart rhythm changes-fast or irregular heartbeat, dizziness, feeling faint or lightheaded, chest pain, trouble breathing Low sodium level-muscle weakness, fatigue, dizziness, headache, confusion Serotonin syndrome-irritability, confusion, fast or irregular heartbeat, muscle stiffness, twitching muscles, sweating, high fever, seizure, chills, vomiting, diarrhea Sudden eye pain or change in vision such as blurry vision, seeing halos around lights, vision loss Thoughts of suicide or self-harm, worsening mood, feelings of depression Side effects that usually do not require medical attention (report to your careteam if they continue or are bothersome): Change in sex drive or performance Diarrhea Dry mouth Excessive sweating Nausea Tremors or shaking Upset stomach This list may not describe all possible side effects. Call your doctor for medical advice about side effects. You may report side effects to FDA at1-800-FDA-1088. Where should I keep my medication? Keep out of reach of children and pets. Store at room temperature between 15 and 30 degrees C (59 and 86 degrees F).Throw away any unused medication after the expiration date. NOTE: This sheet is a summary. It may not cover all possible information. If you have questions about this medicine, talk to your doctor, pharmacist, orhealth care provider.  2022 Elsevier/Gold Standard (2020-06-30 13:54:20)  [facility] [address] [place] [address] [phone]   [facility] [place] [address] [place] [address] [phone]   How to Take Your Blood Pressure Blood pressure is a measurement of how strongly your blood is pressing against the walls  of your arteries. Arteries are blood vessels that carry blood from your  heart throughout your body. Your health care provider takes your blood pressure at each office visit. You can also take your own blood pressure athome with a blood pressure monitor. You may need to take your own blood pressure to: Confirm a diagnosis of high blood pressure (hypertension). Monitor your blood pressure over time. Make sure your blood pressure medicine is working. Supplies needed: Blood pressure monitor. Dining room chair to sit in. Table or desk. Small notebook and pencil or pen. How to prepare To get the most accurate reading, avoid the following for 30 minutes before you check your blood pressure: Drinking caffeine. Drinking alcohol. Eating. Smoking. Exercising. Five minutes before you check your blood pressure: Use the bathroom and urinate so that you have an empty bladder. Sit quietly in a dining room chair. Do not sit in a soft couch or an armchair. Do not talk. How to take your blood pressure To check your blood pressure, follow the instructions in the manual that came with your blood pressure monitor. If you have a digital blood pressure monitor, the instructions may be as follows: Sit up straight in a chair. Place your feet on the floor. Do not cross your ankles or legs. Rest your left arm at the level of your heart on a table or desk or on the arm of a chair. Pull up your shirt sleeve. Wrap the blood pressure cuff around the upper part of your left arm, 1 inch (2.5 cm) above your elbow. It is best to wrap the cuff around bare skin. Fit the cuff snugly around your arm. You should be able to place only one finger between the cuff and your arm. Position the cord so that it rests in the bend of your elbow. Press the power button. Sit quietly while the cuff inflates and deflates. Read the digital reading on the monitor screen and write the numbers down (record them) in a notebook. Wait 2-3  minutes, then repeat the steps, starting at step 1. What does my blood pressure reading mean? A blood pressure reading consists of a higher number over a lower number. Ideally, your blood pressure should be below 120/80. The first ("top") number is called the systolic pressure. It is a measure of the pressure in your arteries as your heart beats. The second ("bottom") number is called the diastolic pressure. It is a measure of the pressure in your arteries as theheart relaxes. Blood pressure is classified into five stages. The following are the stages for adults who do not have a short-term serious illness or a chronic condition. Systolic pressure and diastolic pressure are measured in a unit called mm Hg (millimeters of mercury). Normal Systolic pressure: below 120. Diastolic pressure: below 80. Elevated Systolic pressure: 120-129. Diastolic pressure: below 80. Hypertension stage 1 Systolic pressure: 130-139. Diastolic pressure: 80-89. Hypertension stage 2 Systolic pressure: 140 or above. Diastolic pressure: 90 or above. You can have elevated blood pressure or hypertension even if only the systolicor only the diastolic number in your reading is higher than normal. Follow these instructions at home: Medicines Take over-the-counter and prescription medicines only as told by your health care provider. Tell your health care provider if you are having any side effects from blood pressure medicine. General instructions Check your blood pressure as often as recommended by your health care provider. Check your blood pressure at the same time every day. Take your monitor to the next appointment with your health care provider to make sure that: You are using it correctly.  It provides accurate readings. Understand what your goal blood pressure numbers are. Keep all follow-up visits as told by your health care provider. This is important. General tips Your health care provider can suggest a  reliable monitor that will meet your needs. There are several types of home blood pressure monitors. Choose a monitor that has an arm cuff. Do not choose a monitor that measures your blood pressure from your wrist or finger. Choose a cuff that wraps snugly around your upper arm. You should be able to fit only one finger between your arm and the cuff. You can buy a blood pressure monitor at most drugstores or online. Where to find more information American Heart Association: www.heart.org Contact a health care provider if: Your blood pressure is consistently high. Your blood pressure is suddenly low. Get help right away if: Your systolic blood pressure is higher than 180. Your diastolic blood pressure is higher than 120. Summary Blood pressure is a measurement of how strongly your blood is pressing against the walls of your arteries. A blood pressure reading consists of a higher number over a lower number. Ideally, your blood pressure should be below 120/80. Check your blood pressure at the same time every day. Avoid caffeine, alcohol, smoking, and exercise for 30 minutes prior to checking your blood pressure. These agents can affect the accuracy of the blood pressure reading. This information is not intended to replace advice given to you by your health care provider. Make sure you discuss any questions you have with your healthcare provider. Document Revised: 06/22/2020 Document Reviewed: 08/07/2019 Elsevier Patient Education  2022 [employer].

## 2021-03-02 LAB — CERVICOVAGINAL ANCILLARY ONLY
Bacterial Vaginitis (gardnerella): NEGATIVE
Candida Glabrata: NEGATIVE
Candida Vaginitis: NEGATIVE
Chlamydia: NEGATIVE
Comment: NEGATIVE
Comment: NEGATIVE
Comment: NEGATIVE
Comment: NEGATIVE
Comment: NEGATIVE
Comment: NORMAL
Neisseria Gonorrhea: NEGATIVE
Trichomonas: NEGATIVE

## 2021-03-02 LAB — URINALYSIS, ROUTINE W REFLEX MICROSCOPIC
Bacteria, UA: NONE SEEN /HPF
Bilirubin Urine: NEGATIVE
Glucose, UA: NEGATIVE
Hyaline Cast: NONE SEEN /LPF
Ketones, ur: NEGATIVE
Leukocytes,Ua: NEGATIVE
Nitrite: NEGATIVE
Protein, ur: NEGATIVE
Specific Gravity, Urine: 1.031 (ref 1.001–1.035)
pH: <=5 (ref 5.0–8.0)

## 2021-03-02 LAB — MICROSCOPIC MESSAGE

## 2021-03-02 LAB — CYTOLOGY - PAP
Comment: NEGATIVE
Diagnosis: NEGATIVE
High risk HPV: NEGATIVE

## 2021-03-02 NOTE — Addendum Note (Signed)
Addended by: Elise Benne T on: 03/02/2021 10:58 AM   Modules accepted: Orders

## 2021-03-02 NOTE — Addendum Note (Signed)
Addended by: Warden Fillers on: 03/02/2021 10:59 AM   Modules accepted: Orders

## 2021-03-03 ENCOUNTER — Telehealth: Payer: Self-pay | Admitting: Internal Medicine

## 2021-03-03 DIAGNOSIS — A491 Streptococcal infection, unspecified site: Secondary | ICD-10-CM

## 2021-03-03 NOTE — Telephone Encounter (Signed)
PT called to advise she was returning the missed call about lab results

## 2021-03-03 NOTE — Progress Notes (Signed)
Left message for patient to return call back for lab results.

## 2021-03-04 LAB — URINE CULTURE
MICRO NUMBER:: 12092043
SPECIMEN QUALITY:: ADEQUATE

## 2021-03-06 MED ORDER — AMOXICILLIN-POT CLAVULANATE 875-125 MG PO TABS: 1.0000 | ORAL_TABLET | Freq: Two times a day (BID) | ORAL | 0 refills | Status: DC

## 2021-03-06 NOTE — Telephone Encounter (Signed)
-----   Message from Bevelyn Buckles, MD sent at 03/06/2021  8:23 AM EDT ----- Group B strep growing in urine   -->If having symptoms send in Augmentin bid higher dose 875-125 mg 2x per day with food x 7 days  Add diflucan 150 mg 1x if gets yeast infections please

## 2021-03-06 NOTE — Telephone Encounter (Signed)
Emily Huffman, CMA  03/06/2021  2:14 PM EDT Back to Top     Patient informed and verbalized understanding.    States she does have symptoms but does not get yeast infections. Medicationsent in to preferred pharmacy per protocol.    Pasty Spillers McLean-Scocuzza, MD  03/06/2021  8:23 AM EDT      Group B strep growing in urine    -->If having symptoms send in Augmentin bid higher dose 875-125 mg 2x per daywith food x 7 days  Add diflucan 150 mg 1x if gets yeast infections please

## 2021-03-06 NOTE — Addendum Note (Signed)
Addended by: Tilford Pillar on: 03/06/2021 02:18 PM   Modules accepted: Orders

## 2021-03-24 ENCOUNTER — Other Ambulatory Visit: Payer: Self-pay

## 2021-03-24 ENCOUNTER — Other Ambulatory Visit (HOSPITAL_COMMUNITY)
Admission: RE | Admit: 2021-03-24 | Discharge: 2021-03-24 | Disposition: A | Discharge status: Home / Self Care | Payer: BC Managed Care – PPO | Source: Ambulatory Visit | Attending: Internal Medicine | Admitting: Internal Medicine

## 2021-03-24 ENCOUNTER — Other Ambulatory Visit: Payer: BC Managed Care – PPO

## 2021-03-24 ENCOUNTER — Other Ambulatory Visit: Payer: Self-pay | Admitting: Internal Medicine

## 2021-03-24 ENCOUNTER — Telehealth: Payer: Self-pay | Admitting: Internal Medicine

## 2021-03-24 DIAGNOSIS — N760 Acute vaginitis: Secondary | ICD-10-CM | POA: Insufficient documentation

## 2021-03-24 DIAGNOSIS — N3000 Acute cystitis without hematuria: Secondary | ICD-10-CM | POA: Diagnosis present

## 2021-03-24 DIAGNOSIS — R300 Dysuria: Secondary | ICD-10-CM | POA: Insufficient documentation

## 2021-03-24 NOTE — Telephone Encounter (Signed)
Sch pt dirty and clean urine asap

## 2021-03-24 NOTE — Telephone Encounter (Signed)
Called and spoke with Emily Huffman about her vaginal symptoms. pt states that there is no vaginal odor and the discharge mixes with her blood. Pt was told to go to the Urgent care and declined. I again informed her that Dr. French Ana would want her to be seen for the UTI and asked if shes able to go to a urgent care. Pt declined again. Pt states that she was given amoxicillin on 03/03/21 and that she was told that if she starts experiences any symptoms she needs to call back and speak with Dr. French Ana. She was calling in to see if Dr. French Ana was still willing to send in the UTI medicine that was discussed as a possible side effect to the amoxicillin.

## 2021-03-24 NOTE — Telephone Encounter (Signed)
Patient informed, Due to the high volume of calls and your symptoms we have to forward your call to our Triage Nurse to expedient your call. Please hold for the transfer.  Patient transferred to Access Nurse. Due to having symptoms of a yeast infection from the amoxcillin. Nurse was advise that we had no apts available today in office or virtually.

## 2021-03-24 NOTE — Telephone Encounter (Signed)
Patient is coming in today for urine test

## 2021-03-24 NOTE — Telephone Encounter (Signed)
Pt called into access nurse stating that she is having vaginal itching x2days. Pt declines any discharge but is wearing a tampon for the bleeding.

## 2021-03-27 ENCOUNTER — Telehealth: Payer: Self-pay

## 2021-03-27 DIAGNOSIS — R300 Dysuria: Secondary | ICD-10-CM

## 2021-03-27 NOTE — Telephone Encounter (Signed)
Urine culture reordered.

## 2021-03-28 ENCOUNTER — Other Ambulatory Visit: Payer: Self-pay

## 2021-03-28 ENCOUNTER — Other Ambulatory Visit: Payer: BC Managed Care – PPO

## 2021-03-28 DIAGNOSIS — R300 Dysuria: Secondary | ICD-10-CM

## 2021-03-28 LAB — URINE CYTOLOGY ANCILLARY ONLY
Bacterial Vaginitis-Urine: NEGATIVE
Candida Urine: NEGATIVE
Chlamydia: NEGATIVE
Comment: NEGATIVE
Comment: NEGATIVE
Comment: NORMAL
Neisseria Gonorrhea: NEGATIVE
Trichomonas: NEGATIVE

## 2021-03-29 LAB — URINE CULTURE
MICRO NUMBER:: 12191392
SPECIMEN QUALITY:: ADEQUATE

## 2021-04-03 MED ORDER — CLOBETASOL PROPIONATE 0.05 % EX CREA: 1.0000 "application " | TOPICAL_CREAM | Freq: Two times a day (BID) | CUTANEOUS | 0 refills | Status: DC

## 2021-04-03 NOTE — Addendum Note (Signed)
Addended by: Quentin Ore on: 04/03/2021 06:55 AM   Modules accepted: Orders

## 2021-06-14 ENCOUNTER — Ambulatory Visit: Payer: BC Managed Care – PPO | Admitting: Obstetrics and Gynecology

## 2021-06-15 ENCOUNTER — Ambulatory Visit: Payer: Self-pay | Admitting: Internal Medicine

## 2021-06-29 ENCOUNTER — Encounter: Payer: Self-pay | Admitting: Internal Medicine

## 2021-06-29 ENCOUNTER — Telehealth (INDEPENDENT_AMBULATORY_CARE_PROVIDER_SITE_OTHER): Payer: BC Managed Care – PPO | Admitting: Internal Medicine

## 2021-06-29 ENCOUNTER — Other Ambulatory Visit: Payer: Self-pay

## 2021-06-29 VITALS — Ht 64.96 in | Wt 205.0 lb

## 2021-06-29 DIAGNOSIS — R5383 Other fatigue: Secondary | ICD-10-CM | POA: Diagnosis not present

## 2021-06-29 DIAGNOSIS — F4321 Adjustment disorder with depressed mood: Secondary | ICD-10-CM | POA: Diagnosis not present

## 2021-06-29 DIAGNOSIS — G4700 Insomnia, unspecified: Secondary | ICD-10-CM | POA: Diagnosis not present

## 2021-06-29 DIAGNOSIS — Z1211 Encounter for screening for malignant neoplasm of colon: Secondary | ICD-10-CM | POA: Diagnosis not present

## 2021-06-29 MED ORDER — CITALOPRAM HYDROBROMIDE 20 MG PO TABS: 20.0000 mg | ORAL_TABLET | Freq: Every day | ORAL | 3 refills | Status: DC

## 2021-06-29 MED ORDER — TRAZODONE HCL 50 MG PO TABS: 25.0000 mg | ORAL_TABLET | Freq: Every evening | ORAL | 0 refills | Status: DC | PRN

## 2021-06-29 NOTE — Progress Notes (Signed)
Virtual Visit via Video Note  I connected with Emily Huffman  on 06/29/21 at  3:30 PM EDT by a video enabled telemedicine application and verified that I am speaking with the correct person using two identifiers.  Location patient: home, [place] Location provider:work or home office Persons participating in the virtual visit: patient, provider  I discussed the limitations of evaluation and management by telemedicine and the availability of in person appointments. The patient expressed understanding and agreed to proceed.   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 Ex husband died 03-28-21. On celexa 10 mg qd but mood not much better and tried melatonin lowest dose at night for sleep but It made her too sleepy   -COVID-19 vaccine status:moderna ? date  ROS: See pertinent positives and negatives per HPI.  Past Medical History:  Diagnosis Date   COVID-19    06/2019   Depression    Hyperlipidemia    Hypertension     Past Surgical History:  Procedure Laterality Date   WISDOM TOOTH EXTRACTION       Current Outpatient Medications:    traZODone (DESYREL) 50 MG tablet, Take 0.5-1 tablets (25-50 mg total) by mouth at bedtime as needed for sleep., Disp: 30 tablet, Rfl: 0   amoxicillin-clavulanate (AUGMENTIN) 875-125 MG tablet, Take 1 tablet by mouth 2 (two) times daily. (Patient not taking: Reported on 06/29/2021), Disp: 14 tablet, Rfl: 0   citalopram (CELEXA) 20 MG tablet, Take 1 tablet (20 mg total) by mouth daily., Disp: 90 tablet, Rfl: 3   clobetasol cream (TEMOVATE) 0.98 %, Apply 1 application topically 2 (two) times daily. (Patient not taking: Reported on 06/29/2021), Disp: 30 g, Rfl: 0  EXAM:  VITALS per patient if applicable:  GENERAL: alert, oriented, appears well and in no acute distress  HEENT: atraumatic, conjunttiva clear, no  obvious abnormalities on inspection of external nose and ears  NECK: normal movements of the head and neck  LUNGS: on inspection no signs of respiratory distress, breathing rate appears normal, no obvious gross SOB, gasping or wheezing  CV: no obvious cyanosis  MS: moves all visible extremities without noticeable abnormality  PSYCH/NEURO: pleasant and cooperative, no obvious depression or anxiety, speech and thought processing grossly intact  ASSESSMENT AND PLAN:  Discussed the following assessment and plan:  Adjustment disorder with depressed mood - Plan: citalopram (CELEXA) 20 MG tablet increase from 10 mg qd  Call therapy for appt [facility] given info last visit   Insomnia, unspecified type - Plan: traZODone (DESYREL) 25-50 MG tablet qhs prn   HM Flu shot utd Moderna had 2/2 consider booster? dates Tdap 07/11/17 03/01/21 pap neg neg HPV 03/05/18 mammogram neg ordered  Colonoscopy ordered Leb no reply changed referral to [facility]  Dermatology not needed at this time Declines STD check, MMR and hep B  Fasting labs utd 2022 vit d low rec D3 4000 IU daily   -we discussed possible serious and likely etiologies, options for evaluation and workup, limitations of telemedicine visit vs in person visit, treatment, treatment risks and precautions. Pt is agreeable to treatment via telemedicine at this moment.     I discussed the assessment and treatment plan with the patient. The patient was provided an opportunity to ask questions and all were answered. The patient agreed with the plan and demonstrated an understanding of the instructions.  Time spent 20 minutes   Delorise Jackson, MD

## 2021-07-05 NOTE — Progress Notes (Signed)
[facility] New Patient Evaluation and Consultation  Referring Provider: McLean-Scocuzza, French Ana * PCP: McLean-Scocuzza, Pasty Spillers, MD Date of Service: 07/06/2021  SUBJECTIVE Chief Complaint: New Patient (Initial Visit) - incontinence History of Present Illness: Emily Huffman is a 45 y.o. White or Caucasian female seen in consultation at the request of Dr. Judie Grieve for evaluation of vaginal irritation and incontinence.    Review of records from Dr Shirlee Latch- [facility] significant for: Has been having itching on the vulvar area. Using steroid cream.   Urinary Symptoms: Leaks urine with cough/ sneeze, laughing, and exercise. Has been happening for about a year.  Leaks 1-2 time(s) per day.  Pad use: liners/ mini-pads  She is bothered by her UI symptoms.  Day time voids 6.  Nocturia: 0 times per night to void. Voiding dysfunction: she empties her bladder well.  does not use a catheter to empty bladder.  When urinating, she feels dribbling after finishing Drinks: soda, not as much water   UTIs:  1  UTI's in the last year.  Not currently having any symptoms of UTI Denies history of blood in urine and kidney or bladder stones  Pelvic Organ Prolapse Symptoms:                  She Denies a feeling of a bulge the vaginal area.  Bowel Symptom: Bowel movements: 2 time(s) per day Stool consistency: soft  Straining: no.  Splinting: no.  Incomplete evacuation: no.  She Denies accidental bowel leakage / fecal incontinence Bowel regimen: none  Sexual Function Sexually active: yes.  Sexual orientation:  heterosexual Pain with sex: No  Pelvic Pain Denies pelvic pain   Thought she was maybe having issues with yeast infection. But symptoms have cleared up.   Past Medical History:  Past Medical History:  Diagnosis Date   COVID-19    06/2019   Depression    Hyperlipidemia    Hypertension      Past Surgical History:   Past Surgical History:  Procedure  Laterality Date   WISDOM TOOTH EXTRACTION       Past OB/GYN History: OB History  Gravida Para Term Preterm AB Living  3 3 3     3   SAB IAB Ectopic Multiple Live Births          3    # Outcome Date GA Lbr Len/2nd Weight Sex Delivery Anes PTL Lv  3 Term 2010    F Vag-Spont  N LIV  2 Term 2001    M Vag-Spont  N LIV  1 Term 1999    F Vag-Spont  N LIV   Patient's last menstrual period was 06/14/2021 (within days).  Contraception: none. Last pap smear was 02/2021- neg.  Any history of abnormal pap smears: no.   Medications: She has a current medication list which includes the following prescription(s): citalopram and trazodone.   Allergies: Patient has No Known Allergies.   Social History:  Social History   Tobacco Use   Smoking status: Never   Smokeless tobacco: Never  Vaping Use   Vaping Use: Never used  Substance Use Topics   Alcohol use: Yes    Comment: occas   Drug use: No    Relationship status: divorced She lives with 2 children.   She is employed in the school system. Regular exercise: No History of abuse: No  Family History:   Family History  Problem Relation Age of Onset   Heart attack Mother  age 25   Stroke Father    Diabetes Maternal Grandmother    Heart disease Maternal Grandmother    Stroke Paternal Grandmother    Asthma Daughter    Birth defects Son    Intellectual disability Son    Ovarian cancer Cousin        died 81   Breast cancer Neg Hx      Review of Systems: Review of Systems  Constitutional:  Negative for fever, malaise/fatigue and weight loss.  Respiratory:  Negative for cough, shortness of breath and wheezing.   Cardiovascular:  Negative for chest pain, palpitations and leg swelling.  Gastrointestinal:  Negative for abdominal pain and blood in stool.  Genitourinary:  Negative for dysuria.  Musculoskeletal:  Negative for myalgias.  Skin:  Negative for rash.  Neurological:  Negative for dizziness and headaches.   Endo/Heme/Allergies:  Does not bruise/bleed easily.  Psychiatric/Behavioral:  Negative for depression. The patient is not nervous/anxious.     OBJECTIVE Physical Exam: Vitals:   07/06/21 1109  BP: (!) 148/96  Pulse: (!) 56  Weight: 205 lb (93 kg)  Height: 5\' 4"  (1.626 m)    Physical Exam Constitutional:      General: She is not in acute distress. Pulmonary:     Effort: Pulmonary effort is normal.  Abdominal:     General: There is no distension.     Palpations: Abdomen is soft.     Tenderness: There is no abdominal tenderness. There is no rebound.  Musculoskeletal:        General: No swelling. Normal range of motion.  Skin:    General: Skin is warm and dry.     Findings: No rash.  Neurological:     Mental Status: She is alert and oriented to person, place, and time.  Psychiatric:        Mood and Affect: Mood normal.        Behavior: Behavior normal.     GU / Detailed Urogynecologic Evaluation:  Pelvic Exam: Normal external female genitalia; Bartholin's and Skene's glands normal in appearance; urethral meatus normal in appearance, no urethral masses or discharge.   CST: negative.  Speculum exam reveals normal vaginal mucosa with atrophy. Cervix normal appearance. Scant dark blood in vaginal vault. Uterus normal single, nontender. Adnexa no mass, fullness, tenderness.     Pelvic floor strength II/V  Pelvic floor musculature: Right levator non-tender, Right obturator non-tender, Left levator non-tender, Left obturator non-tender  POP-Q:   POP-Q  -2.5                                            Aa   -2.5                                           Ba  -9                                              C   4  Gh  4                                            Pb  11                                            tvl   -2                                            Ap  -2                                            Bp  -10                                               D     Rectal Exam:  Normal external rectum  Post-Void Residual (PVR) by Bladder Scan: In order to evaluate bladder emptying, we discussed obtaining a postvoid residual and she agreed to this procedure.  Procedure: The ultrasound unit was placed on the patient's abdomen in the suprapubic region after the patient had voided. A PVR of 8 ml was obtained by bladder scan.  Laboratory Results: POC urine: small blood (menstruating currently)   ASSESSMENT AND PLAN Ms. Herington is a 45 y.o. with:  1. Stress incontinence   2. Urinary frequency    SUI - For treatment of stress urinary incontinence,  non-surgical options include expectant management, weight loss, physical therapy, as well as a pessary.  Surgical options include a midurethral sling,  and transurethral injection of a bulking agent. - She would like to start with physical therapy. Referral placed for [facility] rehab.   2. Urinary frequency/ urgency - Has been drinking more soda lately. Advised to cut back and drink more water.   Return as needed   Jaquita Folds, MD   Medical Decision Making:  - Reviewed/ ordered a clinical laboratory test - Review and summation of prior records

## 2021-07-06 ENCOUNTER — Other Ambulatory Visit: Payer: Self-pay

## 2021-07-06 ENCOUNTER — Encounter: Payer: Self-pay | Admitting: Obstetrics and Gynecology

## 2021-07-06 ENCOUNTER — Ambulatory Visit (INDEPENDENT_AMBULATORY_CARE_PROVIDER_SITE_OTHER): Payer: BC Managed Care – PPO | Admitting: Obstetrics and Gynecology

## 2021-07-06 VITALS — BP 148/96 | HR 56 | Ht 64.0 in | Wt 205.0 lb

## 2021-07-06 DIAGNOSIS — R350 Frequency of micturition: Secondary | ICD-10-CM | POA: Diagnosis not present

## 2021-07-06 DIAGNOSIS — N393 Stress incontinence (female) (male): Secondary | ICD-10-CM

## 2021-07-06 LAB — POCT URINALYSIS DIPSTICK
Appearance: NORMAL
Bilirubin, UA: NEGATIVE
Blood, UA: 6
Glucose, UA: NEGATIVE
Ketones, UA: NEGATIVE
Nitrite, UA: NEGATIVE
Protein, UA: NEGATIVE
Spec Grav, UA: >=1.03 — AB (ref 1.010–1.025)
Urobilinogen, UA: 0.2 E.U./dL
pH, UA: 6 (ref 5.0–8.0)

## 2021-07-06 NOTE — Patient Instructions (Signed)

## 2021-11-22 LAB — HM COLONOSCOPY

## 2021-12-10 ENCOUNTER — Encounter: Payer: Self-pay | Admitting: Internal Medicine

## 2022-02-20 ENCOUNTER — Other Ambulatory Visit: Payer: Self-pay | Admitting: Internal Medicine

## 2022-02-20 DIAGNOSIS — Z1231 Encounter for screening mammogram for malignant neoplasm of breast: Secondary | ICD-10-CM

## 2022-03-15 ENCOUNTER — Ambulatory Visit
Admission: RE | Admit: 2022-03-15 | Discharge: 2022-03-15 | Disposition: A | Discharge status: Home / Self Care | Payer: BC Managed Care – PPO | Source: Ambulatory Visit | Attending: Internal Medicine | Admitting: Internal Medicine

## 2022-03-15 DIAGNOSIS — Z1231 Encounter for screening mammogram for malignant neoplasm of breast: Secondary | ICD-10-CM | POA: Insufficient documentation

## 2022-03-20 ENCOUNTER — Encounter: Payer: BC Managed Care – PPO | Admitting: Internal Medicine

## 2022-03-27 ENCOUNTER — Encounter: Payer: BC Managed Care – PPO | Admitting: Internal Medicine

## 2022-07-06 ENCOUNTER — Ambulatory Visit: Payer: BC Managed Care – PPO | Admitting: Family

## 2022-07-06 ENCOUNTER — Encounter: Payer: Self-pay | Admitting: Family

## 2022-07-06 VITALS — BP 128/90 | HR 78 | Temp 96.6°F | Ht 64.0 in | Wt 197.6 lb

## 2022-07-06 DIAGNOSIS — I10 Essential (primary) hypertension: Secondary | ICD-10-CM | POA: Diagnosis not present

## 2022-07-06 DIAGNOSIS — N898 Other specified noninflammatory disorders of vagina: Secondary | ICD-10-CM | POA: Insufficient documentation

## 2022-07-06 LAB — URINALYSIS, ROUTINE W REFLEX MICROSCOPIC
Bilirubin Urine: NEGATIVE
Leukocytes,Ua: NEGATIVE
Nitrite: NEGATIVE
Specific Gravity, Urine: >=1.03 — AB (ref 1.000–1.030)
Total Protein, Urine: NEGATIVE
Urine Glucose: NEGATIVE
Urobilinogen, UA: 0.2 (ref 0.0–1.0)
pH: 5.5 (ref 5.0–8.0)

## 2022-07-06 MED ORDER — FLUCONAZOLE 150 MG PO TABS: 150.0000 mg | ORAL_TABLET | Freq: Once | ORAL | 0 refills | Status: AC

## 2022-07-06 NOTE — Patient Instructions (Addendum)
It is imperative that you are seen AT least twice per year for labs and monitoring. Monitor blood pressure at home and me 5-6 reading on separate days. Goal is less than 120/80, based on newest guidelines, however we certainly want to be less than 130/80;  if persistently higher, please make sooner follow up appointment so we can recheck you blood pressure and manage/ adjust medications.  Please start Diflucan for empiric treatment of suspected yeast infection

## 2022-07-06 NOTE — Progress Notes (Signed)
Subjective:    Patient ID: Elson Areas, female    DOB: [birth year], 46 y.o.   MRN: [id number]  CC: Emily Huffman is a 46 y.o. female who presents today for an acute visit.    HPI: Concern for vaginal yeast infection x 4 days, unchanged  Thick white vaginal discharge  Some improvement with Azo yeast preparation.  She is on her menses. Using tampons and could feel itching, rubbing.   No dysuria, fever, chills, flank pain.      In the past , she has been amlodipine 2.5mg     HISTORY:  Past Medical History:  Diagnosis Date   COVID-19    06/2019   Depression    Hyperlipidemia    Hypertension    Past Surgical History:  Procedure Laterality Date   WISDOM TOOTH EXTRACTION     Family History  Problem Relation Age of Onset   Heart attack Mother        age 49   Stroke Father    Diabetes Maternal Grandmother    Heart disease Maternal Grandmother    Stroke Paternal Grandmother    Asthma Daughter    Birth defects Son    Intellectual disability Son    Ovarian cancer Cousin        died 49   Breast cancer Neg Hx     Allergies: Patient has no known allergies. No current outpatient medications on file prior to visit.   No current facility-administered medications on file prior to visit.    Social History   Tobacco Use   Smoking status: Never   Smokeless tobacco: Never  Vaping Use   Vaping Use: Never used  Substance Use Topics   Alcohol use: Yes    Comment: occas   Drug use: No    Review of Systems  Constitutional:  Negative for chills and fever.  Respiratory:  Negative for cough.   Cardiovascular:  Negative for chest pain and palpitations.  Gastrointestinal:  Negative for nausea and vomiting.      Objective:    BP (!) 128/90   Pulse 78   Temp (!) 96.6 F (35.9 C) (Oral)   Ht 5\' 4"  (1.626 m)   Wt 197 lb 9.6 oz (89.6 kg)   LMP 07/06/2022 (Exact Date)   SpO2 96%   BMI 33.92 kg/m   BP Readings from Last 3 Encounters:  07/06/22 (!) 128/90   07/06/21 (!) 148/96  03/01/21 136/88    Physical Exam Vitals reviewed.  Constitutional:      Appearance: She is well-developed.  Eyes:     Conjunctiva/sclera: Conjunctivae normal.  Cardiovascular:     Rate and Rhythm: Normal rate and regular rhythm.     Pulses: Normal pulses.     Heart sounds: Normal heart sounds.  Pulmonary:     Effort: Pulmonary effort is normal.     Breath sounds: Normal breath sounds. No wheezing, rhonchi or rales.  Skin:    General: Skin is warm and dry.  Neurological:     Mental Status: She is alert.  Psychiatric:        Speech: Speech normal.        Behavior: Behavior normal.        Thought Content: Thought content normal.        Assessment & Plan:    Problem List Items Addressed This Visit       Cardiovascular and Mediastinum   Essential hypertension    Slightly elevated today.  Patient previously  been on amlodipine 2.5 mg and politely declines restarting at this time.  She would like to monitor blood pressure at home .        Genitourinary   Vaginal itching - Primary    Patient politely declined pelvic exam today.  We have obtained a dirty and clean urine.  We will start empiric Diflucan for suspected vaginal yeast infection.      Relevant Medications   fluconazole (DIFLUCAN) 150 MG tablet   Other Relevant Orders   Urinalysis, Routine w reflex microscopic   Urine Culture   Urine cytology ancillary only([place])     I have discontinued Ariah L. Brummet's citalopram and traZODone. I am also having her start on fluconazole.   Meds ordered this encounter  Medications   fluconazole (DIFLUCAN) 150 MG tablet    Sig: Take 1 tablet (150 mg total) by mouth once for 1 dose. Take one tablet PO once. If sxs persist, may take one tablet PO 3 days later.    Dispense:  2 tablet    Refill:  0    Order Specific Question:   Supervising Provider    Answer:   Crecencio Mc [[id number]]    Return precautions given.   Risks, benefits, and  alternatives of the medications and treatment plan prescribed today were discussed, and patient expressed understanding.   Education regarding symptom management and diagnosis given to patient on AVS.  Continue to follow with McLean-Scocuzza, Nino Glow, MD for routine health maintenance.   Reginia Naas and I agreed with plan.   Mable Paris, FNP

## 2022-07-06 NOTE — Assessment & Plan Note (Addendum)
Patient politely declined pelvic exam today.  We have obtained a dirty and clean urine.  We will start empiric Diflucan for suspected vaginal yeast infection.

## 2022-07-06 NOTE — Assessment & Plan Note (Addendum)
Slightly elevated today.  Patient previously been on amlodipine 2.5 mg and politely declines restarting at this time.  She would like to monitor blood pressure at home .

## 2022-07-08 LAB — URINE CULTURE
MICRO NUMBER:: 14173437
SPECIMEN QUALITY:: ADEQUATE

## 2022-07-09 ENCOUNTER — Telehealth: Payer: Self-pay | Admitting: Internal Medicine

## 2022-07-09 ENCOUNTER — Other Ambulatory Visit: Payer: Self-pay

## 2022-07-09 DIAGNOSIS — N898 Other specified noninflammatory disorders of vagina: Secondary | ICD-10-CM

## 2022-07-09 NOTE — Telephone Encounter (Signed)
Spoke to pt and went over results and scheduled pt for return urine retest

## 2022-07-09 NOTE — Telephone Encounter (Signed)
Patient called about her lab results, no one called her.

## 2022-07-10 ENCOUNTER — Other Ambulatory Visit (INDEPENDENT_AMBULATORY_CARE_PROVIDER_SITE_OTHER): Payer: BC Managed Care – PPO

## 2022-07-10 DIAGNOSIS — N898 Other specified noninflammatory disorders of vagina: Secondary | ICD-10-CM | POA: Diagnosis not present

## 2022-07-10 NOTE — Addendum Note (Signed)
Addended by: [place], Ornella Coderre on: 07/10/2022 02:50 PM   Modules accepted: Orders

## 2022-07-11 LAB — URINALYSIS, ROUTINE W REFLEX MICROSCOPIC
Bilirubin Urine: NEGATIVE
Hgb urine dipstick: NEGATIVE
Leukocytes,Ua: NEGATIVE
Nitrite: NEGATIVE
Specific Gravity, Urine: 1.025 (ref 1.000–1.030)
Total Protein, Urine: NEGATIVE
Urine Glucose: NEGATIVE
Urobilinogen, UA: 0.2 (ref 0.0–1.0)
pH: 6 (ref 5.0–8.0)

## 2022-07-12 LAB — URINE CULTURE
MICRO NUMBER:: 14186785
SPECIMEN QUALITY:: ADEQUATE

## 2023-01-03 NOTE — Progress Notes (Signed)
Bethanie Dicker, NP-C Phone: [phone]  Emily Huffman is a 47 y.o. female who presents today for transfer of care and high blood pressure.   HYPERTENSION Disease Monitoring Home BP Monitoring- checking daily 140s/100s Chest pain- No    Dyspnea- No Medications Compliance-  None. Lightheadedness-  No  Edema- No BMET    Component Value Date/Time   NA 139 03/01/2021 0946   K 4.6 03/01/2021 0946   CL 104 03/01/2021 0946   CO2 27 03/01/2021 0946   GLUCOSE 92 03/01/2021 0946   BUN 24 (H) 03/01/2021 0946   CREATININE 0.88 03/01/2021 0946   CALCIUM 9.8 03/01/2021 0946   HYPERLIPIDEMIA Symptoms Chest pain on exertion:  No   Leg claudication:   No Medications: Compliance- None Right upper quadrant pain- No  Muscle aches- No Lipid Panel     Component Value Date/Time   CHOL 209 (H) 03/01/2021 0946   CHOL 206 (H) 04/18/2017 0818   TRIG 99.0 03/01/2021 0946   HDL 58.70 03/01/2021 0946   HDL 52 04/18/2017 0818   CHOLHDL 4 03/01/2021 0946   VLDL 19.8 03/01/2021 0946   LDLCALC 130 (H) 03/01/2021 0946   LDLCALC 131 (H) 04/18/2017 0818   LABVLDL 23 04/18/2017 0818   Anxiety/Depression: PHQ- 5 and GAD- 4 today. Symptoms are well managed currently without medication. She denies SI/HI. She is not currently seeing a counselor or therapist.   Social History   Tobacco Use  Smoking Status Never  Smokeless Tobacco Never    No current outpatient medications on file prior to visit.   No current facility-administered medications on file prior to visit.    ROS see history of present illness  Objective  Physical Exam Vitals:   01/04/23 0808  BP: 130/86  Pulse: (!) 50  Temp: 98.8 F (37.1 C)  SpO2: 97%    BP Readings from Last 3 Encounters:  01/04/23 130/86  07/06/22 (!) 128/90  07/06/21 (!) 148/96   Wt Readings from Last 3 Encounters:  01/04/23 216 lb (98 kg)  07/06/22 197 lb 9.6 oz (89.6 kg)  07/06/21 205 lb (93 kg)    Physical Exam Constitutional:      General:  She is not in acute distress.    Appearance: Normal appearance.  HENT:     Head: Normocephalic.  Cardiovascular:     Rate and Rhythm: Regular rhythm. Bradycardia present.     Heart sounds: Normal heart sounds.  Pulmonary:     Effort: Pulmonary effort is normal.     Breath sounds: Normal breath sounds.  Skin:    General: Skin is warm and dry.  Neurological:     General: No focal deficit present.     Mental Status: She is alert.  Psychiatric:        Mood and Affect: Mood normal.        Behavior: Behavior normal.    Assessment/Plan: Please see individual problem list.  Essential hypertension Assessment & Plan: Chronic. Uncontrolled. Slightly elevated today. Home blood pressure readings reviewed- consistently elevated at 140s/100s. Patient was previously on Amlodipine 2.5 mg. Will start her back on this dose. She will continue checking her blood pressure daily and keep a log to bring with her to her next appointment. She will return in 1-2 weeks for a blood pressure check. Will continue to monitor.   Orders: -     CBC with Differential/Platelet -     Comprehensive metabolic panel -     B12 and Folate Panel -  amLODIPine Besylate; Take 1 tablet (2.5 mg total) by mouth daily.  Dispense: 90 tablet; Refill: 0  Hyperlipidemia, unspecified hyperlipidemia type Assessment & Plan: Chronic. Not on any medications. Will check fasting lipids today. Encouraged healthy diet and exercise.   Orders: -     Lipid panel  Bradycardia Assessment & Plan: Noted on exam, rechecked pulse- 58. Will continue to monitor.  Pulse Readings from Last 3 Encounters:  01/04/23 (!) 50  07/06/22 78  07/06/21 (!) 56     Depression, recurrent (HCC) Assessment & Plan: Stable at this time without medications. Encouraged to contact if worsening symptoms, unusual behavior changes or suicidal thoughts occur. Will continue to monitor.    Vitamin D deficiency Assessment & Plan: Chronic. Not taking a  supplement. Will check Vitamin D level today.  Orders: -     VITAMIN D 25 Hydroxy (Vit-D Deficiency, Fractures)  Screening mammogram for breast cancer -     3D Screening Mammogram, Left and Right; Future  Thyroid disorder screen -     TSH    Return in about 2 weeks (around 01/18/2023) for Blood pressure check then 3 month follow up.   Bethanie Dicker, NP-C [facility] [employer]

## 2023-01-04 ENCOUNTER — Encounter: Payer: Self-pay | Admitting: Nurse Practitioner

## 2023-01-04 ENCOUNTER — Telehealth: Payer: Self-pay

## 2023-01-04 ENCOUNTER — Ambulatory Visit: Payer: BC Managed Care – PPO | Admitting: Nurse Practitioner

## 2023-01-04 VITALS — BP 130/86 | HR 50 | Temp 98.8°F | Ht 64.0 in | Wt 216.0 lb

## 2023-01-04 DIAGNOSIS — R001 Bradycardia, unspecified: Secondary | ICD-10-CM | POA: Diagnosis not present

## 2023-01-04 DIAGNOSIS — F339 Major depressive disorder, recurrent, unspecified: Secondary | ICD-10-CM

## 2023-01-04 DIAGNOSIS — Z1329 Encounter for screening for other suspected endocrine disorder: Secondary | ICD-10-CM | POA: Diagnosis not present

## 2023-01-04 DIAGNOSIS — Z1231 Encounter for screening mammogram for malignant neoplasm of breast: Secondary | ICD-10-CM

## 2023-01-04 DIAGNOSIS — E559 Vitamin D deficiency, unspecified: Secondary | ICD-10-CM | POA: Diagnosis not present

## 2023-01-04 DIAGNOSIS — E785 Hyperlipidemia, unspecified: Secondary | ICD-10-CM | POA: Diagnosis not present

## 2023-01-04 DIAGNOSIS — I10 Essential (primary) hypertension: Secondary | ICD-10-CM

## 2023-01-04 LAB — CBC WITH DIFFERENTIAL/PLATELET
Basophils Absolute: 0 10*3/uL (ref 0.0–0.1)
Basophils Relative: 0.8 % (ref 0.0–3.0)
Eosinophils Absolute: 0.3 10*3/uL (ref 0.0–0.7)
Eosinophils Relative: 5.2 % — ABNORMAL HIGH (ref 0.0–5.0)
HCT: 39.5 % (ref 36.0–46.0)
Hemoglobin: 13.2 g/dL (ref 12.0–15.0)
Lymphocytes Relative: 28.2 % (ref 12.0–46.0)
Lymphs Abs: 1.8 10*3/uL (ref 0.7–4.0)
MCHC: 33.4 g/dL (ref 30.0–36.0)
MCV: 84.6 fl (ref 78.0–100.0)
Monocytes Absolute: 0.4 10*3/uL (ref 0.1–1.0)
Monocytes Relative: 5.6 % (ref 3.0–12.0)
Neutro Abs: 3.8 10*3/uL (ref 1.4–7.7)
Neutrophils Relative %: 60.2 % (ref 43.0–77.0)
Platelets: 285 10*3/uL (ref 150.0–400.0)
RBC: 4.67 Mil/uL (ref 3.87–5.11)
RDW: 13.7 % (ref 11.5–15.5)
WBC: 6.3 10*3/uL (ref 4.0–10.5)

## 2023-01-04 LAB — COMPREHENSIVE METABOLIC PANEL
ALT: 23 U/L (ref 0–35)
AST: 22 U/L (ref 0–37)
Albumin: 4.2 g/dL (ref 3.5–5.2)
Alkaline Phosphatase: 60 U/L (ref 39–117)
BUN: 17 mg/dL (ref 6–23)
CO2: 27 mEq/L (ref 19–32)
Calcium: 9.1 mg/dL (ref 8.4–10.5)
Chloride: 105 mEq/L (ref 96–112)
Creatinine, Ser: 0.81 mg/dL (ref 0.40–1.20)
GFR: 86.58 mL/min (ref >60.00)
Glucose, Bld: 96 mg/dL (ref 70–99)
Potassium: 4.8 mEq/L (ref 3.5–5.1)
Sodium: 139 mEq/L (ref 135–145)
Total Bilirubin: 0.4 mg/dL (ref 0.2–1.2)
Total Protein: 7 g/dL (ref 6.0–8.3)

## 2023-01-04 LAB — LIPID PANEL
Cholesterol: 179 mg/dL (ref 0–200)
HDL: 51.5 mg/dL (ref >39.00)
LDL Cholesterol: 114 mg/dL — ABNORMAL HIGH (ref 0–99)
NonHDL: 127.68
Total CHOL/HDL Ratio: 3
Triglycerides: 70 mg/dL (ref 0.0–149.0)
VLDL: 14 mg/dL (ref 0.0–40.0)

## 2023-01-04 LAB — B12 AND FOLATE PANEL
Folate: 11 ng/mL (ref >5.9)
Vitamin B-12: 332 pg/mL (ref 211–911)

## 2023-01-04 LAB — VITAMIN D 25 HYDROXY (VIT D DEFICIENCY, FRACTURES): VITD: 21.77 ng/mL — ABNORMAL LOW (ref 30.00–100.00)

## 2023-01-04 LAB — TSH: TSH: 1.58 u[IU]/mL (ref 0.35–5.50)

## 2023-01-04 MED ORDER — AMLODIPINE BESYLATE 2.5 MG PO TABS
2.5000 mg | ORAL_TABLET | Freq: Every day | ORAL | 0 refills | Status: DC
Start: 2023-01-04 — End: 2023-04-10

## 2023-01-04 NOTE — Assessment & Plan Note (Addendum)
Noted on exam, rechecked pulse- 58. Will continue to monitor.  Pulse Readings from Last 3 Encounters:  01/04/23 (!) 50  07/06/22 78  07/06/21 (!) 56

## 2023-01-04 NOTE — Assessment & Plan Note (Signed)
Chronic. Not taking a supplement. Will check Vitamin D level today.

## 2023-01-04 NOTE — Patient Instructions (Signed)
YOUR MAMMOGRAM IS DUE, PLEASE CALL AND GET THIS SCHEDULED! [facility] - call [phone]    

## 2023-01-04 NOTE — Assessment & Plan Note (Signed)
Stable at this time without medications. Encouraged to contact if worsening symptoms, unusual behavior changes or suicidal thoughts occur. Will continue to monitor.

## 2023-01-04 NOTE — Assessment & Plan Note (Signed)
Chronic. Uncontrolled. Slightly elevated today. Home blood pressure readings reviewed- consistently elevated at 140s/100s. Patient was previously on Amlodipine 2.5 mg. Will start her back on this dose. She will continue checking her blood pressure daily and keep a log to bring with her to her next appointment. She will return in 1-2 weeks for a blood pressure check. Will continue to monitor.

## 2023-01-04 NOTE — Telephone Encounter (Signed)
At check-out today, we had to schedule appointment for blood pressure check further out beyond the 2-week timeframe in our check-out instructions because of patient's schedule.  Patient states she will have the school nurse check her blood pressure in the meantime.

## 2023-01-04 NOTE — Assessment & Plan Note (Signed)
Chronic. Not on any medications. Will check fasting lipids today. Encouraged healthy diet and exercise.

## 2023-01-14 ENCOUNTER — Telehealth: Payer: Self-pay

## 2023-01-14 NOTE — Telephone Encounter (Signed)
LMOM for pt to CB in regards to lab results, we received a notification stating pt did not read Kacy's mychart msg in regards to results.     Hi Emily Huffman,   Your labs look stable. Your vitamin D continues to be low, I recommend taking an over the counter daily supplement for this. Please let me know if you have any questions.   Thanks, Arville Lime, NP

## 2023-02-05 ENCOUNTER — Ambulatory Visit: Payer: BC Managed Care – PPO

## 2023-02-05 VITALS — BP 130/82

## 2023-02-05 DIAGNOSIS — I10 Essential (primary) hypertension: Secondary | ICD-10-CM

## 2023-02-05 NOTE — Progress Notes (Signed)
Patient here for nurse visit BP check per order from Bethanie Dicker, NP.   Patient reports compliance with prescribed BP medications: yes, pt taking amlodipine 2.5mg  at bedtime daily.   Last dose of BP medication:   BP Readings from Last 3 Encounters:  02/05/23 130/82  01/04/23 130/86  07/06/22 (!) 128/90   Pulse Readings from Last 3 Encounters:  01/04/23 (!) 50  07/06/22 78  07/06/21 (!) 56     Patient reports that the dizziness she was previously having has improved.    Valentino Nose, RN

## 2023-03-27 ENCOUNTER — Encounter (INDEPENDENT_AMBULATORY_CARE_PROVIDER_SITE_OTHER): Payer: Self-pay

## 2023-04-09 NOTE — Progress Notes (Unsigned)
Bethanie Dicker, NP-C Phone: [phone]  Emily Huffman is a 47 y.o. female who presents today for follow up.   HYPERTENSION Disease Monitoring Home BP Monitoring- Not checking Chest pain- No    Dyspnea- No Medications Compliance-  Norvasc. Lightheadedness-  No  Edema- No BMET    Component Value Date/Time   NA 139 01/04/2023 0827   K 4.8 01/04/2023 0827   CL 105 01/04/2023 0827   CO2 27 01/04/2023 0827   GLUCOSE 96 01/04/2023 0827   BUN 17 01/04/2023 0827   CREATININE 0.81 01/04/2023 0827   CALCIUM 9.1 01/04/2023 0827   HYPERLIPIDEMIA Symptoms Chest pain on exertion:  No   Leg claudication:   No Medications: Compliance- Diet controlled Right upper quadrant pain- No  Muscle aches- No Lipid Panel     Component Value Date/Time   CHOL 179 01/04/2023 0827   CHOL 206 (H) 04/18/2017 0818   TRIG 70.0 01/04/2023 0827   HDL 51.50 01/04/2023 0827   HDL 52 04/18/2017 0818   CHOLHDL 3 01/04/2023 0827   VLDL 14.0 01/04/2023 0827   LDLCALC 114 (H) 01/04/2023 0827   LDLCALC 131 (H) 04/18/2017 0818   LABVLDL 23 04/18/2017 0818   Depression- Patient with worsening depression symptoms. She has felt very tired. She does not want to get out of bed. She has little interest in doing things. She is irritable. She has been on medications in the past but does not remember what. She is open to trying a medication today. She does not see Psychiatry or a therapist. She denies SI/HI.   Social History   Tobacco Use  Smoking Status Never  Smokeless Tobacco Never    No current outpatient medications on file prior to visit.   No current facility-administered medications on file prior to visit.    ROS see history of present illness  Objective  Physical Exam Vitals:   04/10/23 0802  BP: 124/80  Pulse: (!) 56  Temp: 98 F (36.7 C)  SpO2: 98%    BP Readings from Last 3 Encounters:  04/10/23 124/80  02/05/23 130/82  01/04/23 130/86   Wt Readings from Last 3 Encounters:  04/10/23  211 lb 9.6 oz (96 kg)  01/04/23 216 lb (98 kg)  07/06/22 197 lb 9.6 oz (89.6 kg)    Physical Exam Constitutional:      General: She is not in acute distress.    Appearance: Normal appearance.  HENT:     Head: Normocephalic.  Cardiovascular:     Rate and Rhythm: Normal rate and regular rhythm.     Heart sounds: Normal heart sounds.  Pulmonary:     Effort: Pulmonary effort is normal.     Breath sounds: Normal breath sounds.  Skin:    General: Skin is warm and dry.  Neurological:     General: No focal deficit present.     Mental Status: She is alert.  Psychiatric:        Mood and Affect: Mood normal. Affect is tearful.        Behavior: Behavior normal.     Assessment/Plan: Please see individual problem list.  Depression, recurrent (HCC) Assessment & Plan: Will start patient on Zoloft 50 mg daily. Counseled patient on common side effects. Encouraged to contact if worsening symptoms, unusual behavior changes or suicidal thoughts occur. Denies SI/HI today. She will follow up in 6 weeks, sooner PRN. Will continue to monitor.   Orders: -     Sertraline HCl; Take 1 tablet (50 mg  total) by mouth daily.  Dispense: 90 tablet; Refill: 0  Essential hypertension Assessment & Plan: Chronic. Stable on Norvasc 2.5 mg daily. Continue. BP at goal today. Refills sent. Will continue to monitor.   Orders: -     amLODIPine Besylate; Take 1 tablet (2.5 mg total) by mouth daily.  Dispense: 90 tablet; Refill: 3  Hyperlipidemia, unspecified hyperlipidemia type Assessment & Plan: Chronic. Stable with diet control. The 10-year ASCVD risk score (Arnett DK, et al., 2019) is: 1.1%. Encouraged healthy diet and exercise.    Vitamin D deficiency Assessment & Plan: Chronic issue. Patient has started taking a daily over the counter vitamin D supplement. Will plan to recheck level in the future.     Return in about 6 weeks (around 05/22/2023) for Anxiety/Depression.   Bethanie Dicker, NP-C [facility] [employer]

## 2023-04-10 ENCOUNTER — Ambulatory Visit: Payer: BC Managed Care – PPO | Admitting: Nurse Practitioner

## 2023-04-10 ENCOUNTER — Encounter: Payer: Self-pay | Admitting: Nurse Practitioner

## 2023-04-10 VITALS — BP 124/80 | HR 56 | Temp 98.0°F | Ht 64.0 in | Wt 211.6 lb

## 2023-04-10 DIAGNOSIS — E559 Vitamin D deficiency, unspecified: Secondary | ICD-10-CM | POA: Diagnosis not present

## 2023-04-10 DIAGNOSIS — E785 Hyperlipidemia, unspecified: Secondary | ICD-10-CM

## 2023-04-10 DIAGNOSIS — F339 Major depressive disorder, recurrent, unspecified: Secondary | ICD-10-CM | POA: Diagnosis not present

## 2023-04-10 DIAGNOSIS — I10 Essential (primary) hypertension: Secondary | ICD-10-CM | POA: Diagnosis not present

## 2023-04-10 MED ORDER — AMLODIPINE BESYLATE 2.5 MG PO TABS
2.5000 mg | ORAL_TABLET | Freq: Every day | ORAL | 3 refills | Status: DC
Start: 2023-04-10 — End: 2024-05-29

## 2023-04-10 MED ORDER — SERTRALINE HCL 50 MG PO TABS
50.0000 mg | ORAL_TABLET | Freq: Every day | ORAL | 0 refills | Status: DC
Start: 2023-04-10 — End: 2023-08-30

## 2023-04-10 NOTE — Assessment & Plan Note (Signed)
Will start patient on Zoloft 50 mg daily. Counseled patient on common side effects. Encouraged to contact if worsening symptoms, unusual behavior changes or suicidal thoughts occur. Denies SI/HI today. She will follow up in 6 weeks, sooner PRN. Will continue to monitor.

## 2023-04-10 NOTE — Assessment & Plan Note (Signed)
Chronic. Stable on Norvasc 2.5 mg daily. Continue. BP at goal today. Refills sent. Will continue to monitor.

## 2023-04-10 NOTE — Assessment & Plan Note (Signed)
Chronic. Stable with diet control. The 10-year ASCVD risk score (Arnett DK, et al., 2019) is: 1.1%. Encouraged healthy diet and exercise.

## 2023-04-10 NOTE — Assessment & Plan Note (Signed)
Chronic issue. Patient has started taking a daily over the counter vitamin D supplement. Will plan to recheck level in the future.

## 2023-04-11 ENCOUNTER — Encounter (INDEPENDENT_AMBULATORY_CARE_PROVIDER_SITE_OTHER): Payer: Self-pay

## 2023-05-22 ENCOUNTER — Ambulatory Visit: Payer: BC Managed Care – PPO | Admitting: Nurse Practitioner

## 2023-07-01 ENCOUNTER — Ambulatory Visit: Payer: BC Managed Care – PPO | Admitting: Podiatry

## 2023-08-29 ENCOUNTER — Other Ambulatory Visit: Payer: Self-pay | Admitting: Nurse Practitioner

## 2023-08-29 DIAGNOSIS — F339 Major depressive disorder, recurrent, unspecified: Secondary | ICD-10-CM

## 2023-10-02 ENCOUNTER — Encounter: Payer: 59 | Admitting: Nurse Practitioner

## 2023-10-25 ENCOUNTER — Encounter: Payer: Self-pay | Admitting: Nurse Practitioner

## 2023-10-25 ENCOUNTER — Telehealth: Payer: Self-pay

## 2023-10-25 ENCOUNTER — Ambulatory Visit: Payer: 59 | Admitting: Nurse Practitioner

## 2023-10-25 VITALS — BP 130/98 | HR 54 | Temp 98.2°F | Ht 64.0 in | Wt 218.4 lb

## 2023-10-25 DIAGNOSIS — G479 Sleep disorder, unspecified: Secondary | ICD-10-CM

## 2023-10-25 DIAGNOSIS — E785 Hyperlipidemia, unspecified: Secondary | ICD-10-CM

## 2023-10-25 DIAGNOSIS — E669 Obesity, unspecified: Secondary | ICD-10-CM | POA: Diagnosis not present

## 2023-10-25 DIAGNOSIS — Z0001 Encounter for general adult medical examination with abnormal findings: Secondary | ICD-10-CM | POA: Insufficient documentation

## 2023-10-25 DIAGNOSIS — Z0000 Encounter for general adult medical examination without abnormal findings: Secondary | ICD-10-CM | POA: Diagnosis not present

## 2023-10-25 DIAGNOSIS — E559 Vitamin D deficiency, unspecified: Secondary | ICD-10-CM

## 2023-10-25 DIAGNOSIS — Z1329 Encounter for screening for other suspected endocrine disorder: Secondary | ICD-10-CM | POA: Diagnosis not present

## 2023-10-25 DIAGNOSIS — I10 Essential (primary) hypertension: Secondary | ICD-10-CM

## 2023-10-25 MED ORDER — ZEPBOUND 2.5 MG/0.5ML ~~LOC~~ SOAJ: 2.5000 mg | SUBCUTANEOUS | 0 refills | Status: DC

## 2023-10-25 MED ORDER — ZEPBOUND 5 MG/0.5ML ~~LOC~~ SOAJ: 5.0000 mg | SUBCUTANEOUS | 0 refills | Status: DC

## 2023-10-25 NOTE — Assessment & Plan Note (Signed)
 Physical exam complete. She will return for fasting labs next week. Continue with scheduled mammogram next week. Pap smear and colonoscopy are up to date. Flu vaccine and tetanus shot are up to date. Encourage regular dental and eye exams. Encourage healthy diet and regular exercise.

## 2023-10-25 NOTE — Assessment & Plan Note (Signed)
 Blood pressure is elevated today due to non-compliance with Norvasc 2.5mg . Emphasized the importance of medication adherence for effective blood pressure control. Resume Norvasc 2.5mg  daily and recheck blood pressure in 6 weeks at follow up. Encourage decreased sodium intake and regular exercise.

## 2023-10-25 NOTE — Assessment & Plan Note (Signed)
 Reports frequent awakenings and poor sleep quality without racing thoughts or anxiety. Suggested trial of over-the-counter sleep aids like Unisom or Z-Quil. Melatonin has been tried previously and made patient drowsy the next day.

## 2023-10-25 NOTE — Progress Notes (Signed)
 Emily Dicker, NP-C Phone: [phone]  Emily Huffman is a 48 y.o. female who presents today for annual exam.   Discussed the use of AI scribe software for clinical note transcription with the patient, who gave verbal consent to proceed.  History of Present Illness   Emily Huffman is a 48 year old female with hypertension who presents for an annual physical exam.  She has a history of hypertension and has not been taking her prescribed medications, including Norvasc, which she was started on almost a year ago. Her blood pressure was well-controlled when she was taking the medication, but she has since stopped all medications, including those for mood, anxiety, and depression. She does not feel depressed and attributes her fatigue to being busy and always on the go.  She is struggling with weight management, having tried various methods including cutting out sugars and carbs and increasing protein intake. She previously tried phentermine for weight loss but discontinued it due to high blood pressure concerns. She wants to lose weight, with a goal of reaching 175 pounds from her current weight of 218 pounds.  She reports knee pain, particularly after working at [employer], which is exacerbated by going downstairs. She also mentions persistent numbness in one finger that has been present for a long time. No swelling in her legs, and she denies new or worsening joint pains.  Her menstrual cycles are regular but have been lasting longer, sometimes more than a week, with spotting before and after the period. She experiences one to two days of heavy bleeding during her cycle.  She has difficulty sleeping, waking up frequently, and attributes this to worrying about others. She previously tried melatonin but found it made her feel drowsy and unable to function the next day.      Social History   Tobacco Use  Smoking Status Never  Smokeless Tobacco Never    Current Outpatient Medications on File  Prior to Visit  Medication Sig Dispense Refill   amLODipine (NORVASC) 2.5 MG tablet Take 1 tablet (2.5 mg total) by mouth daily. (Patient not taking: Reported on 10/25/2023) 90 tablet 3   No current facility-administered medications on file prior to visit.    ROS see history of present illness  Objective  Physical Exam Vitals:   10/25/23 1426 10/25/23 1519  BP: (!) 136/90 (!) 130/98  Pulse: (!) 54   Temp: 98.2 F (36.8 C)   SpO2: 98%     BP Readings from Last 3 Encounters:  10/25/23 (!) 130/98  04/10/23 124/80  02/05/23 130/82   Wt Readings from Last 3 Encounters:  10/25/23 218 lb 6.4 oz (99.1 kg)  04/10/23 211 lb 9.6 oz (96 kg)  01/04/23 216 lb (98 kg)    Physical Exam Constitutional:      General: She is not in acute distress.    Appearance: Normal appearance. She is obese.  HENT:     Head: Normocephalic.     Right Ear: Tympanic membrane normal.     Left Ear: Tympanic membrane normal.     Nose: Nose normal.     Mouth/Throat:     Mouth: Mucous membranes are moist.     Pharynx: Oropharynx is clear.  Eyes:     Conjunctiva/sclera: Conjunctivae normal.     Pupils: Pupils are equal, round, and reactive to light.  Neck:     Thyroid: No thyromegaly.  Cardiovascular:     Rate and Rhythm: Normal rate and regular rhythm.     Heart  sounds: Normal heart sounds.  Pulmonary:     Effort: Pulmonary effort is normal.     Breath sounds: Normal breath sounds.  Abdominal:     General: Abdomen is flat. Bowel sounds are normal.     Palpations: Abdomen is soft. There is no mass.     Tenderness: There is no abdominal tenderness.  Musculoskeletal:        General: Normal range of motion.  Lymphadenopathy:     Cervical: No cervical adenopathy.  Skin:    General: Skin is warm and dry.     Findings: No rash.  Neurological:     General: No focal deficit present.     Mental Status: She is alert.  Psychiatric:        Mood and Affect: Mood normal.        Behavior: Behavior  normal.     Assessment/Plan: Please see individual problem list.  Encounter for routine adult medical exam with abnormal findings Assessment & Plan: Physical exam complete. She will return for fasting labs next week. Continue with scheduled mammogram next week. Pap smear and colonoscopy are up to date. Flu vaccine and tetanus shot are up to date. Encourage regular dental and eye exams. Encourage healthy diet and regular exercise.    Obesity (BMI 30-39.9) Assessment & Plan: Struggling with weight loss, aiming for a goal weight of 175 lbs. Discussed weight loss benefits for blood pressure and cardiovascular health. Start Zepbound injections for weight loss, pending insurance approval. Counseled on the risk of pancreatitis and gallbladder disease. Discussed the risk of nausea. Advised to discontinue the Zepbound and contact us immediately if they develop abdominal pain. If they develop excessive nausea they will contact us right away. I discussed that medullary thyroid cancer has been seen in rats studies. The patient confirmed no personal or family history of thyroid cancer, parathyroid cancer, or adrenal gland cancer. Discussed that we thus far have not seen medullary thyroid cancer result from use of this type of medication in humans. Advised to monitor the thyroid area and contact us for any lumps, swelling, trouble swallowing, or any other changes in this area.  Discussed goal weight loss of 1 to 2 pounds a week while on this medication. Discussed the importance of healthy diet, exercise and lifestyle modifications even with this medication. Continue dietary modifications with low sugar, low carb, and high protein. Follow up in 6 weeks to assess weight loss progress.  Orders: -     Hemoglobin A1c; Future -     Zepbound; Inject 2.5 mg into the skin once a week.  Dispense: 2 mL; Refill: 0 -     Zepbound; Inject 5 mg into the skin once a week.  Dispense: 2 mL; Refill: 0  Essential  hypertension Assessment & Plan: Blood pressure is elevated today due to non-compliance with Norvasc 2.5mg . Emphasized the importance of medication adherence for effective blood pressure control. Resume Norvasc 2.5mg  daily and recheck blood pressure in 6 weeks at follow up. Encourage decreased sodium intake and regular exercise.   Orders: -     CBC with Differential/Platelet; Future -     Comprehensive metabolic panel; Future  Sleep disturbances Assessment & Plan: Reports frequent awakenings and poor sleep quality without racing thoughts or anxiety. Suggested trial of over-the-counter sleep aids like Unisom or Z-Quil. Melatonin has been tried previously and made patient drowsy the next day.    Hyperlipidemia, unspecified hyperlipidemia type Assessment & Plan: Currently stable with diet control. The 10-year ASCVD risk  score (Arnett DK, et al., 2019) is: 1.3%. We will check fasting lipid panel.   Orders: -     Lipid panel; Future  Vitamin D deficiency -     VITAMIN D 25 Hydroxy (Vit-D Deficiency, Fractures); Future  Thyroid disorder screen -     TSH; Future    Return for fasting labs then in 6 weeks for follow up.   Emily Dicker, NP-C [facility]

## 2023-10-25 NOTE — Assessment & Plan Note (Signed)
 Struggling with weight loss, aiming for a goal weight of 175 lbs. Discussed weight loss benefits for blood pressure and cardiovascular health. Start Zepbound injections for weight loss, pending insurance approval. Counseled on the risk of pancreatitis and gallbladder disease. Discussed the risk of nausea. Advised to discontinue the Zepbound and contact us immediately if they develop abdominal pain. If they develop excessive nausea they will contact us right away. I discussed that medullary thyroid cancer has been seen in rats studies. The patient confirmed no personal or family history of thyroid cancer, parathyroid cancer, or adrenal gland cancer. Discussed that we thus far have not seen medullary thyroid cancer result from use of this type of medication in humans. Advised to monitor the thyroid area and contact us for any lumps, swelling, trouble swallowing, or any other changes in this area.  Discussed goal weight loss of 1 to 2 pounds a week while on this medication. Discussed the importance of healthy diet, exercise and lifestyle modifications even with this medication. Continue dietary modifications with low sugar, low carb, and high protein. Follow up in 6 weeks to assess weight loss progress.

## 2023-10-25 NOTE — Assessment & Plan Note (Signed)
 Currently stable with diet control. The 10-year ASCVD risk score (Arnett DK, et al., 2019) is: 1.3%. We will check fasting lipid panel.

## 2023-10-25 NOTE — Telephone Encounter (Signed)
 PA needed for zepbound

## 2023-10-28 ENCOUNTER — Telehealth: Payer: Self-pay

## 2023-10-28 ENCOUNTER — Other Ambulatory Visit (HOSPITAL_COMMUNITY): Payer: Self-pay

## 2023-10-28 NOTE — Telephone Encounter (Signed)
 Pharmacy Patient Advocate Encounter   Received notification from Pt Calls Messages that prior authorization for Zepbound 2.5mg /dose is required/requested.   Insurance verification completed.   The patient is insured through [facility] ADVANTAGE/RX ADVANCE .   Per test claim: The current 28 day co-pay is, $1035.19.  No PA needed at this time. This test claim was processed through [facility]- copay amounts may vary at other pharmacies due to pharmacy/plan contracts, or as the patient moves through the different stages of their insurance plan.

## 2023-10-28 NOTE — Telephone Encounter (Signed)
 No PA is needed, test claim was run successfully through [employer], however, insurance expects pt to pay full cash price for [employer]. No PA submitted at this time.

## 2023-10-31 ENCOUNTER — Ambulatory Visit
Admission: RE | Admit: 2023-10-31 | Discharge: 2023-10-31 | Disposition: A | Discharge status: Home / Self Care | Payer: 59 | Source: Ambulatory Visit | Attending: Nurse Practitioner | Admitting: Nurse Practitioner

## 2023-10-31 DIAGNOSIS — Z1231 Encounter for screening mammogram for malignant neoplasm of breast: Secondary | ICD-10-CM | POA: Insufficient documentation

## 2023-11-01 ENCOUNTER — Other Ambulatory Visit: Payer: 59

## 2023-11-06 ENCOUNTER — Encounter: Payer: Self-pay | Admitting: Nurse Practitioner

## 2023-11-25 ENCOUNTER — Ambulatory Visit: Admitting: Family

## 2023-11-25 ENCOUNTER — Ambulatory Visit: Payer: Self-pay | Admitting: Nurse Practitioner

## 2023-11-25 ENCOUNTER — Encounter: Payer: Self-pay | Admitting: Family

## 2023-11-25 VITALS — BP 132/78 | HR 66 | Temp 98.0°F | Ht 64.0 in | Wt 216.6 lb

## 2023-11-25 DIAGNOSIS — R899 Unspecified abnormal finding in specimens from other organs, systems and tissues: Secondary | ICD-10-CM

## 2023-11-25 DIAGNOSIS — R350 Frequency of micturition: Secondary | ICD-10-CM | POA: Diagnosis not present

## 2023-11-25 DIAGNOSIS — R300 Dysuria: Secondary | ICD-10-CM

## 2023-11-25 LAB — POCT URINALYSIS DIPSTICK
Bilirubin, UA: NEGATIVE
Glucose, UA: NEGATIVE
Ketones, UA: NEGATIVE
Nitrite, UA: NEGATIVE
Protein, UA: POSITIVE — AB
Spec Grav, UA: >=1.03 — AB (ref 1.010–1.025)
Urobilinogen, UA: 0.2 U/dL
pH, UA: 5.5 (ref 5.0–8.0)

## 2023-11-25 LAB — URINALYSIS, ROUTINE W REFLEX MICROSCOPIC
Bilirubin Urine: NEGATIVE
Ketones, ur: NEGATIVE
Nitrite: NEGATIVE
Specific Gravity, Urine: >=1.03 — AB (ref 1.000–1.030)
Total Protein, Urine: NEGATIVE
Urine Glucose: NEGATIVE
Urobilinogen, UA: 0.2 (ref 0.0–1.0)
pH: 6 (ref 5.0–8.0)

## 2023-11-25 MED ORDER — NITROFURANTOIN MONOHYD MACRO 100 MG PO CAPS: 100.0000 mg | ORAL_CAPSULE | Freq: Two times a day (BID) | ORAL | 0 refills | Status: DC

## 2023-11-25 NOTE — Patient Instructions (Signed)
 Start Macrobid Ensure to take probiotics while on antibiotics and also for 2 weeks after completion. This can either be by eating yogurt daily or taking a probiotic supplement over the counter such as Culturelle.It is important to re-colonize the gut with good bacteria and also to prevent any diarrheal infections associated with antibiotic use.   Plenty of water

## 2023-11-25 NOTE — Telephone Encounter (Signed)
 Pt scheduled to see Jason Coop, NP today at noon. Scheduled by our scheduling team.

## 2023-11-25 NOTE — Telephone Encounter (Signed)
 Call dropped upon transfer. This RN made one outbound attempt to reach patient. No answer, left a message. Will route for additional attempts.   Copied from CRM [id number]. Topic: Clinical - Red Word Triage >> Nov 25, 2023  8:25 AM Pascal Lux wrote: Red Word that prompted transfer to Nurse Triage: Patient stated she is in pain, feeling some pressure.

## 2023-11-25 NOTE — Telephone Encounter (Signed)
 This RN made third attempt to triage patient. No answer, left a message. Routing to office for further action.

## 2023-11-25 NOTE — Telephone Encounter (Signed)
 This RN made second attempt to triage patient. No answer, left a message. Will route for additional attempts.

## 2023-11-25 NOTE — Assessment & Plan Note (Signed)
 Urine point-of-care trace leukocytes, trace RBCs.  Concern for early UTI.  Patient agreed to start Macrobid ahead of urinalysis, urine culture.  Advised probiotics.  She will let me know how she is doing.

## 2023-11-25 NOTE — Progress Notes (Signed)
 Assessment & Plan:  Urinary frequency Assessment & Plan: Urine point-of-care trace leukocytes, trace RBCs.  Concern for early UTI.  Patient agreed to start Macrobid ahead of urinalysis, urine culture.  Advised probiotics.  She will let me know how she is doing.   Orders: -     Nitrofurantoin Monohyd Macro; Take 1 capsule (100 mg total) by mouth 2 (two) times daily. Take with food.  Dispense: 10 capsule; Refill: 0  Dysuria -     POCT urinalysis dipstick -     Urinalysis, Routine w reflex microscopic -     Urine Culture     Return precautions given.   Risks, benefits, and alternatives of the medications and treatment plan prescribed today were discussed, and patient expressed understanding.   Education regarding symptom management and diagnosis given to patient on AVS either electronically or printed.  No follow-ups on file.  Rennie Plowman, FNP  Subjective:    Patient ID: Emily Huffman, female    DOB: 1976/08/12, 48 y.o.   MRN: [id number]  CC: Emily Huffman is a 48 y.o. female who presents today for an acute visit.    HPI: Complains of urinary pressure and urinary pressure x 1 day No urinary incontinence, dysuria, fever, flank pain, rectal bleeding, constipation   No recent UTI   LMP 11/01/23     No h/o CKD Urine culture 07/10/2022 Streptococcus agalactiae   Allergies: Patient has no known allergies. Current Outpatient Medications on File Prior to Visit  Medication Sig Dispense Refill   amLODipine (NORVASC) 2.5 MG tablet Take 1 tablet (2.5 mg total) by mouth daily. (Patient not taking: Reported on 11/25/2023) 90 tablet 3   tirzepatide (ZEPBOUND) 2.5 MG/0.5ML Pen Inject 2.5 mg into the skin once a week. (Patient not taking: Reported on 11/25/2023) 2 mL 0   tirzepatide (ZEPBOUND) 5 MG/0.5ML Pen Inject 5 mg into the skin once a week. (Patient not taking: Reported on 11/25/2023) 2 mL 0   No current facility-administered medications on file prior to visit.     Review of Systems  Constitutional:  Negative for chills and fever.  Respiratory:  Negative for cough.   Cardiovascular:  Negative for chest pain and palpitations.  Gastrointestinal:  Negative for nausea and vomiting.  Genitourinary:  Positive for frequency, pelvic pain and urgency. Negative for difficulty urinating and vaginal discharge.      Objective:    BP 132/78   Pulse 66   Temp 98 F (36.7 C) (Oral)   Ht 5\' 4"  (1.626 m)   Wt 216 lb 9.6 oz (98.2 kg)   LMP 10/04/2023   SpO2 98%   BMI 37.18 kg/m   BP Readings from Last 3 Encounters:  11/25/23 132/78  10/25/23 (!) 130/98  04/10/23 124/80   Wt Readings from Last 3 Encounters:  11/25/23 216 lb 9.6 oz (98.2 kg)  10/25/23 218 lb 6.4 oz (99.1 kg)  04/10/23 211 lb 9.6 oz (96 kg)    Physical Exam Vitals reviewed.  Constitutional:      Appearance: She is well-developed.  Cardiovascular:     Rate and Rhythm: Normal rate and regular rhythm.     Pulses: Normal pulses.     Heart sounds: Normal heart sounds.  Pulmonary:     Effort: Pulmonary effort is normal.     Breath sounds: Normal breath sounds. No wheezing, rhonchi or rales.  Skin:    General: Skin is warm and dry.  Neurological:     Mental Status: She is  alert.  Psychiatric:        Speech: Speech normal.        Behavior: Behavior normal.        Thought Content: Thought content normal.

## 2023-11-26 LAB — URINE CULTURE
MICRO NUMBER:: 16268130
Result:: NO GROWTH
SPECIMEN QUALITY:: ADEQUATE

## 2023-11-28 ENCOUNTER — Encounter: Payer: Self-pay | Admitting: Family

## 2023-11-28 NOTE — Addendum Note (Signed)
 Addended by: [place], Zaylei Mullane on: 11/28/2023 04:09 PM   Modules accepted: Orders

## 2023-12-06 ENCOUNTER — Ambulatory Visit: Payer: 59 | Admitting: Nurse Practitioner

## 2023-12-19 ENCOUNTER — Other Ambulatory Visit

## 2023-12-20 ENCOUNTER — Other Ambulatory Visit

## 2023-12-20 DIAGNOSIS — Z1329 Encounter for screening for other suspected endocrine disorder: Secondary | ICD-10-CM

## 2023-12-20 DIAGNOSIS — E669 Obesity, unspecified: Secondary | ICD-10-CM

## 2023-12-20 DIAGNOSIS — E785 Hyperlipidemia, unspecified: Secondary | ICD-10-CM | POA: Diagnosis not present

## 2023-12-20 DIAGNOSIS — R899 Unspecified abnormal finding in specimens from other organs, systems and tissues: Secondary | ICD-10-CM | POA: Diagnosis not present

## 2023-12-20 DIAGNOSIS — E559 Vitamin D deficiency, unspecified: Secondary | ICD-10-CM

## 2023-12-20 DIAGNOSIS — I10 Essential (primary) hypertension: Secondary | ICD-10-CM | POA: Diagnosis not present

## 2023-12-20 LAB — CBC WITH DIFFERENTIAL/PLATELET
Basophils Absolute: 0 10*3/uL (ref 0.0–0.1)
Basophils Relative: 0.4 % (ref 0.0–3.0)
Eosinophils Absolute: 0.2 10*3/uL (ref 0.0–0.7)
Eosinophils Relative: 3.5 % (ref 0.0–5.0)
HCT: 41.6 % (ref 36.0–46.0)
Hemoglobin: 13.9 g/dL (ref 12.0–15.0)
Lymphocytes Relative: 25.5 % (ref 12.0–46.0)
Lymphs Abs: 1.6 10*3/uL (ref 0.7–4.0)
MCHC: 33.3 g/dL (ref 30.0–36.0)
MCV: 84.3 fl (ref 78.0–100.0)
Monocytes Absolute: 0.4 10*3/uL (ref 0.1–1.0)
Monocytes Relative: 6.6 % (ref 3.0–12.0)
Neutro Abs: 4.1 10*3/uL (ref 1.4–7.7)
Neutrophils Relative %: 64 % (ref 43.0–77.0)
Platelets: 289 10*3/uL (ref 150.0–400.0)
RBC: 4.93 Mil/uL (ref 3.87–5.11)
RDW: 14.3 % (ref 11.5–15.5)
WBC: 6.5 10*3/uL (ref 4.0–10.5)

## 2023-12-20 LAB — URINALYSIS, ROUTINE W REFLEX MICROSCOPIC
Bilirubin Urine: NEGATIVE
Ketones, ur: NEGATIVE
Nitrite: NEGATIVE
Specific Gravity, Urine: 1.02 (ref 1.000–1.030)
Total Protein, Urine: NEGATIVE
Urine Glucose: NEGATIVE
Urobilinogen, UA: 0.2 (ref 0.0–1.0)
pH: 6 (ref 5.0–8.0)

## 2023-12-20 LAB — LIPID PANEL
Cholesterol: 180 mg/dL (ref 0–200)
HDL: 56.4 mg/dL (ref >39.00)
LDL Cholesterol: 108 mg/dL — ABNORMAL HIGH (ref 0–99)
NonHDL: 123.44
Total CHOL/HDL Ratio: 3
Triglycerides: 75 mg/dL (ref 0.0–149.0)
VLDL: 15 mg/dL (ref 0.0–40.0)

## 2023-12-20 LAB — COMPREHENSIVE METABOLIC PANEL WITH GFR
ALT: 11 U/L (ref 0–35)
AST: 13 U/L (ref 0–37)
Albumin: 4.4 g/dL (ref 3.5–5.2)
Alkaline Phosphatase: 50 U/L (ref 39–117)
BUN: 15 mg/dL (ref 6–23)
CO2: 28 meq/L (ref 19–32)
Calcium: 9.2 mg/dL (ref 8.4–10.5)
Chloride: 104 meq/L (ref 96–112)
Creatinine, Ser: 0.78 mg/dL (ref 0.40–1.20)
GFR: 89.99 mL/min (ref >60.00)
Glucose, Bld: 88 mg/dL (ref 70–99)
Potassium: 4.3 meq/L (ref 3.5–5.1)
Sodium: 139 meq/L (ref 135–145)
Total Bilirubin: 0.5 mg/dL (ref 0.2–1.2)
Total Protein: 7.5 g/dL (ref 6.0–8.3)

## 2023-12-20 LAB — VITAMIN D 25 HYDROXY (VIT D DEFICIENCY, FRACTURES): VITD: 29.5 ng/mL — ABNORMAL LOW (ref 30.00–100.00)

## 2023-12-20 LAB — HEMOGLOBIN A1C: Hgb A1c MFr Bld: 5.8 % (ref 4.6–6.5)

## 2023-12-20 LAB — TSH: TSH: 1.77 u[IU]/mL (ref 0.35–5.50)

## 2023-12-21 LAB — URINE CULTURE
MICRO NUMBER:: 16376631
Result:: NO GROWTH
SPECIMEN QUALITY:: ADEQUATE

## 2023-12-23 ENCOUNTER — Encounter: Payer: Self-pay | Admitting: Family

## 2023-12-23 ENCOUNTER — Encounter: Payer: Self-pay | Admitting: Nurse Practitioner

## 2023-12-24 ENCOUNTER — Other Ambulatory Visit: Payer: Self-pay | Admitting: Family

## 2023-12-24 DIAGNOSIS — R319 Hematuria, unspecified: Secondary | ICD-10-CM

## 2024-01-03 ENCOUNTER — Encounter (HOSPITAL_COMMUNITY): Payer: Self-pay

## 2024-01-10 ENCOUNTER — Encounter: Admitting: Family

## 2024-01-10 DIAGNOSIS — R319 Hematuria, unspecified: Secondary | ICD-10-CM

## 2024-01-10 LAB — URINALYSIS, ROUTINE W REFLEX MICROSCOPIC
Bilirubin Urine: NEGATIVE
Hgb urine dipstick: NEGATIVE
Ketones, ur: NEGATIVE
Leukocytes,Ua: NEGATIVE
Nitrite: NEGATIVE
Specific Gravity, Urine: 1.025 (ref 1.000–1.030)
Total Protein, Urine: NEGATIVE
Urine Glucose: NEGATIVE
Urobilinogen, UA: 0.2 (ref 0.0–1.0)
pH: 6 (ref 5.0–8.0)

## 2024-01-10 NOTE — Progress Notes (Signed)
 This encounter was created in error - please disregard.

## 2024-01-11 LAB — URINE CULTURE
MICRO NUMBER:: 16466089
Result:: NO GROWTH
SPECIMEN QUALITY:: ADEQUATE

## 2024-01-13 ENCOUNTER — Ambulatory Visit: Payer: Self-pay | Admitting: Family

## 2024-01-27 ENCOUNTER — Ambulatory Visit (INDEPENDENT_AMBULATORY_CARE_PROVIDER_SITE_OTHER)

## 2024-01-27 ENCOUNTER — Telehealth: Payer: Self-pay

## 2024-01-27 DIAGNOSIS — Z111 Encounter for screening for respiratory tuberculosis: Secondary | ICD-10-CM

## 2024-01-27 NOTE — Telephone Encounter (Signed)
 Pt had a nurse visit today for TB test and dropped off a health assessment form that needs provider signature. Form has been placed in provider to be signed folder. Pt also has a form for her TB screen results to be filled out and she plans to pick up the health assessment form when she returns to have her TB read/ other form to be filled out.

## 2024-01-27 NOTE — Progress Notes (Signed)
 PPD Placement note Alvaro Augusta, 48 y.o. female is here today for placement of PPD test Reason for PPD test: work Pt taken PPD test before: yes Verified in allergy area and with patient that they are not allergic to the products PPD is made of (Phenol or Tween). No: no Is patient taking any oral or IV steroid medication now or have they taken it in the last month? no Has the patient ever received the BCG vaccine?: no Has the patient been in recent contact with anyone known or suspected of having active TB disease?: no      Date of exposure (if applicable): n/a      Name of person they were exposed to (if applicable): n/a Patient's Country of origin?: [place]  O: Alert and oriented in NAD. P:  PPD placed on 01/27/2024.  Patient advised to return for reading within 48-72 hours.  PPD placed in right forearm.

## 2024-01-29 ENCOUNTER — Ambulatory Visit

## 2024-01-29 NOTE — Progress Notes (Signed)
 PPD Reading Note  PPD read and results entered in [employer].  Result: 0 mm induration.  Interpretation: negative  If test not read within 48-72 hours of initial placement, patient advised to repeat in other arm 1-3 weeks after this test.  Allergic reaction: no

## 2024-01-31 NOTE — Telephone Encounter (Signed)
 Called and informed pt that form was ready for pick up. Form placed in designated pick up area

## 2024-03-20 ENCOUNTER — Telehealth: Payer: Self-pay

## 2024-03-20 NOTE — Telephone Encounter (Signed)
 Copied from CRM [id number]. Topic: Clinical - Medication Prior Auth >> Mar 20, 2024  1:03 PM [place] J wrote: Reason for CRM: Patient has different insurance and would like to know if she could try to get a prior authorization for tirzepatide  (ZEPBOUND ) again.  She would also like to know if an appointment is necessary since it's been a couple months until Leron has seen her in regards to the medication.  If there is a way she could try ozempic since it's cheaper than tirzepatide , she would like to have a discussion about trying to get ozempic authorized.

## 2024-04-30 ENCOUNTER — Encounter: Payer: Self-pay | Admitting: Nurse Practitioner

## 2024-04-30 ENCOUNTER — Ambulatory Visit: Admitting: Nurse Practitioner

## 2024-04-30 ENCOUNTER — Telehealth: Payer: Self-pay

## 2024-04-30 VITALS — BP 138/90 | HR 57 | Temp 97.7°F | Ht 64.0 in | Wt 215.6 lb

## 2024-04-30 DIAGNOSIS — I10 Essential (primary) hypertension: Secondary | ICD-10-CM

## 2024-04-30 DIAGNOSIS — Z6837 Body mass index (BMI) 37.0-37.9, adult: Secondary | ICD-10-CM

## 2024-04-30 DIAGNOSIS — E66812 Obesity, class 2: Secondary | ICD-10-CM | POA: Diagnosis not present

## 2024-04-30 MED ORDER — ZEPBOUND 5 MG/0.5ML ~~LOC~~ SOAJ: 5.0000 mg | SUBCUTANEOUS | 0 refills | Status: DC

## 2024-04-30 MED ORDER — ZEPBOUND 2.5 MG/0.5ML ~~LOC~~ SOAJ: 2.5000 mg | SUBCUTANEOUS | 0 refills | Status: DC

## 2024-04-30 NOTE — Progress Notes (Signed)
 Leron Glance, NP-C Phone: [phone]  Emily Huffman is a 48 y.o. female who presents today for follow up.   Discussed the use of AI scribe software for clinical note transcription with the patient, who gave verbal consent to proceed.  History of Present Illness   Emily Huffman is a 48 year old female with hypertension who presents for follow-up and to discuss weight loss injections.  She has been taking amlodipine  2.5 mg daily for hypertension and is adherent to the regimen. She attributes the elevated reading to being 'flustered' and rushing to the appointment. No headaches, chest pain, shortness of breath, or dizziness.  She is interested in starting weight loss injections, specifically Zepbound , which her new insurance now covers. Previously, her state insurance did not cover the cost, leading to a lapse in treatment. She has no history of thyroid cancer.      Social History   Tobacco Use  Smoking Status Never  Smokeless Tobacco Never    Current Outpatient Medications on File Prior to Visit  Medication Sig Dispense Refill   amLODipine  (NORVASC ) 2.5 MG tablet Take 1 tablet (2.5 mg total) by mouth daily. (Patient not taking: Reported on 11/25/2023) 90 tablet 3   No current facility-administered medications on file prior to visit.     ROS see history of present illness  Objective  Physical Exam Vitals:   04/30/24 1515 04/30/24 1540  BP: (!) 150/98 (!) 138/90  Pulse: (!) 57   Temp: 97.7 F (36.5 C)   SpO2: 99%     BP Readings from Last 3 Encounters:  04/30/24 (!) 138/90  11/25/23 132/78  10/25/23 (!) 130/98   Wt Readings from Last 3 Encounters:  04/30/24 215 lb 9.6 oz (97.8 kg)  11/25/23 216 lb 9.6 oz (98.2 kg)  10/25/23 218 lb 6.4 oz (99.1 kg)    Physical Exam Constitutional:      General: She is not in acute distress.    Appearance: Normal appearance.  HENT:     Head: Normocephalic.  Cardiovascular:     Rate and Rhythm: Normal rate and regular  rhythm.     Heart sounds: Normal heart sounds.  Pulmonary:     Effort: Pulmonary effort is normal.     Breath sounds: Normal breath sounds.  Skin:    General: Skin is warm and dry.  Neurological:     General: No focal deficit present.     Mental Status: She is alert.  Psychiatric:        Mood and Affect: Mood normal.        Behavior: Behavior normal.      Assessment/Plan: Please see individual problem list.  Class 2 severe obesity due to excess calories with serious comorbidity and body mass index (BMI) of 37.0 to 37.9 in adult [facility]) Assessment & Plan: Struggling with weight loss, aiming for a goal weight of 175 lbs. Discussed weight loss benefits for blood pressure and cardiovascular health. Start Zepbound  injections for weight loss, pending insurance approval. Counseled on the risk of pancreatitis and gallbladder disease. Discussed the risk of nausea. Advised to discontinue the Zepbound  and contact us  immediately if they develop abdominal pain. If they develop excessive nausea they will contact us  right away. I discussed that medullary thyroid cancer has been seen in rats studies. The patient confirmed no personal or family history of thyroid cancer, parathyroid cancer, or adrenal gland cancer. Discussed that we thus far have not seen medullary thyroid cancer result from use of this type  of medication in humans. Advised to monitor the thyroid area and contact us  for any lumps, swelling, trouble swallowing, or any other changes in this area.  Discussed goal weight loss of 1 to 2 pounds a week while on this medication. Discussed the importance of healthy diet, exercise and lifestyle modifications even with this medication. Continue dietary modifications with low sugar, low carb, and high protein. Follow up in 6 weeks to assess weight loss progress.  Orders: -     Zepbound ; Inject 2.5 mg into the skin once a week. X 4 weeks then increase to 5 mg.  Dispense: 2 mL; Refill: 0 -     Zepbound ;  Inject 5 mg into the skin once a week.  Dispense: 2 mL; Refill: 0  Essential hypertension Assessment & Plan: Blood pressure is elevated at 150/98 mmHg, possibly due to stress. She adheres to amlodipine  2.5 mg daily. Recheck blood pressure before leaving the clinic, improvement noted. Continue amlodipine  2.5 mg daily. Evaluate blood pressure at a six-week follow-up appointment and consider adjusting medication if it remains elevated.       Return in about 6 weeks (around 06/11/2024) for Follow up.   Leron Glance, NP-C  Primary Care - [facility] - [facility]

## 2024-04-30 NOTE — Telephone Encounter (Signed)
 PA needed for zepbound  pt has new insurance

## 2024-05-04 ENCOUNTER — Other Ambulatory Visit (HOSPITAL_COMMUNITY): Payer: Self-pay

## 2024-05-04 ENCOUNTER — Telehealth: Payer: Self-pay

## 2024-05-04 NOTE — Telephone Encounter (Signed)
 Pharmacy Patient Advocate Encounter  Received notification from [employer] that Prior Authorization for Zepbound  2.5MG /0.5ML pen-injectors  has been APPROVED from 05/04/24 to 11/01/24. Ran test claim, Copay is $24.99. This test claim was processed through [facility]- copay amounts may vary at other pharmacies due to pharmacy/plan contracts, or as the patient moves through the different stages of their insurance plan.   PA #/Case ID/Reference #: [id number]

## 2024-05-04 NOTE — Telephone Encounter (Signed)
 PA request has been Submitted. New Encounter has been or will be created for follow up. For additional info see Pharmacy Prior Auth telephone encounter from 05/04/24.

## 2024-05-04 NOTE — Telephone Encounter (Signed)
 Pharmacy Patient Advocate Encounter   Received notification from Pt Calls Messages that prior authorization for Zepbound  2.5MG /0.5ML pen-injectors  is required/requested.   Insurance verification completed.   The patient is insured through [facility] .   Per test claim: PA required; PA submitted to above mentioned insurance via Latent Key/confirmation #/EOC [id number] Status is pending

## 2024-05-04 NOTE — Telephone Encounter (Signed)
Called and informed pt of this information. 

## 2024-05-05 NOTE — Telephone Encounter (Unsigned)
 Copied from CRM #[id number]. Topic: General - Other >> May 05, 2024  9:38 AM Rosina BIRCH wrote: Reason for CRM: maria from [employer] called stating the zepbound  prior authorization was approved up to 11/01/2024 Case [id number]

## 2024-05-13 ENCOUNTER — Encounter: Payer: Self-pay | Admitting: Nurse Practitioner

## 2024-05-13 NOTE — Assessment & Plan Note (Signed)
 Struggling with weight loss, aiming for a goal weight of 175 lbs. Discussed weight loss benefits for blood pressure and cardiovascular health. Start Zepbound injections for weight loss, pending insurance approval. Counseled on the risk of pancreatitis and gallbladder disease. Discussed the risk of nausea. Advised to discontinue the Zepbound and contact us immediately if they develop abdominal pain. If they develop excessive nausea they will contact us right away. I discussed that medullary thyroid cancer has been seen in rats studies. The patient confirmed no personal or family history of thyroid cancer, parathyroid cancer, or adrenal gland cancer. Discussed that we thus far have not seen medullary thyroid cancer result from use of this type of medication in humans. Advised to monitor the thyroid area and contact us for any lumps, swelling, trouble swallowing, or any other changes in this area.  Discussed goal weight loss of 1 to 2 pounds a week while on this medication. Discussed the importance of healthy diet, exercise and lifestyle modifications even with this medication. Continue dietary modifications with low sugar, low carb, and high protein. Follow up in 6 weeks to assess weight loss progress.

## 2024-05-13 NOTE — Assessment & Plan Note (Addendum)
 Blood pressure is elevated at 150/98 mmHg, possibly due to stress. She adheres to amlodipine  2.5 mg daily. Recheck blood pressure before leaving the clinic, improvement noted. Continue amlodipine  2.5 mg daily. Evaluate blood pressure at a six-week follow-up appointment and consider adjusting medication if it remains elevated.

## 2024-05-28 ENCOUNTER — Other Ambulatory Visit: Payer: Self-pay | Admitting: Nurse Practitioner

## 2024-05-28 DIAGNOSIS — I10 Essential (primary) hypertension: Secondary | ICD-10-CM

## 2024-06-11 ENCOUNTER — Ambulatory Visit: Admitting: Nurse Practitioner

## 2024-06-11 ENCOUNTER — Encounter: Payer: Self-pay | Admitting: Nurse Practitioner

## 2024-06-11 VITALS — BP 118/79 | HR 50 | Temp 98.2°F | Ht 64.0 in | Wt 205.4 lb

## 2024-06-11 DIAGNOSIS — E66812 Obesity, class 2: Secondary | ICD-10-CM

## 2024-06-11 DIAGNOSIS — I10 Essential (primary) hypertension: Secondary | ICD-10-CM

## 2024-06-11 DIAGNOSIS — Z6835 Body mass index (BMI) 35.0-35.9, adult: Secondary | ICD-10-CM | POA: Diagnosis not present

## 2024-06-11 DIAGNOSIS — Z6837 Body mass index (BMI) 37.0-37.9, adult: Secondary | ICD-10-CM | POA: Diagnosis not present

## 2024-06-11 MED ORDER — ZEPBOUND 5 MG/0.5ML ~~LOC~~ SOAJ: 5.0000 mg | SUBCUTANEOUS | 0 refills | Status: DC

## 2024-06-11 NOTE — Assessment & Plan Note (Signed)
 She has lost 10 pounds over six weeks with Zepbound , experiencing decreased appetite and reduced calorie intake. This is a healthy weight loss, and further reduction is expected with a goal of reaching under 200 pounds. Continue Zepbound  5 mg for six more weeks. Encourage hydration, protein intake, and physical activity. Discuss a potential increase to 7.5 mg after completing 2 months of 5 mg weekly. We will continue to monitor.

## 2024-06-11 NOTE — Progress Notes (Signed)
 Leron Glance, NP-C Phone: [phone]  Emily Huffman is a 48 y.o. female who presents today for follow up.   Discussed the use of AI scribe software for clinical note transcription with the patient, who gave verbal consent to proceed.  History of Present Illness   Emily Huffman is a 48 year old female who presents for a six-week follow-up on Zepbound  treatment.  She started Zepbound  at a dose of 2.5 mg for four weeks and then increased to 5 mg. Today marks her second dose of the 5 mg regimen, completing six weeks of treatment. She tolerates the medication well overall but experiences diarrhea on certain days, which can wake her up at night with stomach gurgling. The diarrhea typically resolves by mid-morning and may recur in the evening. She describes it as mild, akin to 'maybe like you ate [place] food', and it occurs about two days a week.  Her fluid intake has increased, and she is drinking more water than before. She reports a decreased appetite, often eating only once a day. Her typical intake includes a protein shake in the morning and a meal provided at work, such as a baked potato with chicken and cheese. She occasionally consumes diet sodas, limiting herself to one per day. She has lost ten pounds since starting the medication, which she attributes to changes in her diet and fluid intake.  She is currently taking amlodipine  2.5 mg for blood pressure management, which has been stable. She monitors her weight regularly and has noticed a total weight loss of twelve pounds at home.  No nausea, constipation, swallowing difficulties, or mood changes.      Social History   Tobacco Use  Smoking Status Never  Smokeless Tobacco Never    Current Outpatient Medications on File Prior to Visit  Medication Sig Dispense Refill   amLODipine  (NORVASC ) 2.5 MG tablet TAKE 1 TABLET(2.5 MG) BY MOUTH DAILY 90 tablet 0   No current facility-administered medications on file prior to visit.      ROS see history of present illness  Objective  Physical Exam Vitals:   06/11/24 1604  BP: 118/79  Pulse: (!) 50  Temp: 98.2 F (36.8 C)  SpO2: 99%    BP Readings from Last 3 Encounters:  06/11/24 118/79  04/30/24 (!) 138/90  11/25/23 132/78   Wt Readings from Last 3 Encounters:  06/11/24 205 lb 6.4 oz (93.2 kg)  04/30/24 215 lb 9.6 oz (97.8 kg)  11/25/23 216 lb 9.6 oz (98.2 kg)    Physical Exam Constitutional:      General: She is not in acute distress.    Appearance: Normal appearance.  HENT:     Head: Normocephalic.  Cardiovascular:     Rate and Rhythm: Normal rate and regular rhythm.     Heart sounds: Normal heart sounds.  Pulmonary:     Effort: Pulmonary effort is normal.     Breath sounds: Normal breath sounds.  Skin:    General: Skin is warm and dry.  Neurological:     General: No focal deficit present.     Mental Status: She is alert.  Psychiatric:        Mood and Affect: Mood normal.        Behavior: Behavior normal.      Assessment/Plan: Please see individual problem list.  Class 2 severe obesity due to excess calories with serious comorbidity and body mass index (BMI) of 37.0 to 37.9 in adult Assessment & Plan: She has lost  10 pounds over six weeks with Zepbound , experiencing decreased appetite and reduced calorie intake. This is a healthy weight loss, and further reduction is expected with a goal of reaching under 200 pounds. Continue Zepbound  5 mg for six more weeks. Encourage hydration, protein intake, and physical activity. Discuss a potential increase to 7.5 mg after completing 2 months of 5 mg weekly. We will continue to monitor.   Orders: -     Zepbound ; Inject 5 mg into the skin once a week.  Dispense: 2 mL; Refill: 0  BMI 35.0-35.9,adult  Essential hypertension Assessment & Plan: Her hypertension is controlled with amlodipine  2.5 mg, likely aided by weight loss. There is potential to discontinue the medication if blood pressure  remains stable with further weight loss. Continue amlodipine  2.5 mg and monitor for hypotension symptoms, especially with further weight loss.        Return in about 2 months (around 08/11/2024) for Follow up.   Leron Glance, NP-C [facility]

## 2024-06-11 NOTE — Assessment & Plan Note (Signed)
 Her hypertension is controlled with amlodipine  2.5 mg, likely aided by weight loss. There is potential to discontinue the medication if blood pressure remains stable with further weight loss. Continue amlodipine  2.5 mg and monitor for hypotension symptoms, especially with further weight loss.

## 2024-07-29 ENCOUNTER — Other Ambulatory Visit: Payer: Self-pay | Admitting: Nurse Practitioner

## 2024-07-29 DIAGNOSIS — Z6837 Body mass index (BMI) 37.0-37.9, adult: Secondary | ICD-10-CM

## 2024-08-25 ENCOUNTER — Encounter: Payer: Self-pay | Admitting: Nurse Practitioner

## 2024-08-25 ENCOUNTER — Ambulatory Visit: Admitting: Nurse Practitioner

## 2024-08-25 VITALS — BP 126/88 | HR 48 | Temp 98.3°F | Ht 64.0 in | Wt 184.6 lb

## 2024-08-25 DIAGNOSIS — Z6831 Body mass index (BMI) 31.0-31.9, adult: Secondary | ICD-10-CM

## 2024-08-25 DIAGNOSIS — E66812 Obesity, class 2: Secondary | ICD-10-CM

## 2024-08-25 DIAGNOSIS — Z6837 Body mass index (BMI) 37.0-37.9, adult: Secondary | ICD-10-CM

## 2024-08-25 DIAGNOSIS — I10 Essential (primary) hypertension: Secondary | ICD-10-CM | POA: Diagnosis not present

## 2024-08-25 MED ORDER — ZEPBOUND 7.5 MG/0.5ML ~~LOC~~ SOAJ: 7.5000 mg | SUBCUTANEOUS | 2 refills | Status: AC

## 2024-08-25 NOTE — Assessment & Plan Note (Signed)
 She has been off her amlodipine  for several weeks. Blood pressure is well-controlled at 126/88 mmHg today, likely due to weight loss, with potential for further improvement with continued weight loss. Continue monitoring blood pressure and reassess at the next visit. Hold off on resuming blood pressure medication until then.

## 2024-08-25 NOTE — Progress Notes (Signed)
 " Leron Glance, NP-C Phone: [phone]  Emily Huffman is a 48 y.o. female who presents today for follow up.   Discussed the use of AI scribe software for clinical note transcription with the patient, who gave verbal consent to proceed.  History of Present Illness   Emily Huffman is a 48 year old female who presents for a two-month follow-up regarding weight loss management.  She is satisfied with her weight loss progress, having achieved her initial goal of being under 200 pounds. She currently weighs 184 pounds, reflecting a 21-pound loss since her last appointment 10 weeks ago and a total loss of over 30 pounds since starting the program.  She is on 5 mg of Zepbound  weekly but experiences hunger a couple of days before her next dose, which may indicate a plateau in weight loss. Her physical activity is limited to walking, and she does not engage in other forms of exercise. Her diet consists of a late lunch and sometimes a light dinner, with occasional diet soda consumption.  She has not taken her blood pressure medication for a couple of weeks due to concerns about a change in the pill's appearance. Her blood pressure is currently 126/88.  Her mood, anxiety, and depression are stable, despite personal stressors, including a recent breakup and the need to find a new place to live.      Tobacco Use History[1]  Medications Ordered Prior to Encounter[2]   ROS see history of present illness  Objective  Physical Exam Vitals:   08/25/24 1456  BP: 126/88  Pulse: (!) 48  Temp: 98.3 F (36.8 C)  SpO2: 99%    BP Readings from Last 3 Encounters:  08/25/24 126/88  06/11/24 118/79  04/30/24 (!) 138/90   Wt Readings from Last 3 Encounters:  08/25/24 184 lb 9.6 oz (83.7 kg)  06/11/24 205 lb 6.4 oz (93.2 kg)  04/30/24 215 lb 9.6 oz (97.8 kg)    Physical Exam Constitutional:      General: She is not in acute distress.    Appearance: Normal appearance.  HENT:     Head:  Normocephalic.  Cardiovascular:     Rate and Rhythm: Normal rate and regular rhythm.     Heart sounds: Normal heart sounds.  Pulmonary:     Effort: Pulmonary effort is normal.     Breath sounds: Normal breath sounds.  Skin:    General: Skin is warm and dry.  Neurological:     General: No focal deficit present.     Mental Status: She is alert.  Psychiatric:        Mood and Affect: Mood normal.        Behavior: Behavior normal.      Assessment/Plan: Please see individual problem list.  Class 2 severe obesity due to excess calories with serious comorbidity and body mass index (BMI) of 37.0 to 37.9 in adult Assessment & Plan: Significant weight loss achieved, total 34 pounds, with Zepbound , now at 184 pounds, though weight loss has plateaued with increased hunger. Emphasized the importance of maintaining muscle mass and bone density through protein intake and weight-bearing exercises. Discussed the risk of weight regain post-medication without lifestyle changes. Increased Zepbound  to 7.5 mg weekly. Encourage healthy diet and high protein diet. Recommend regular exercise and a daily vitamin D  supplement or multivitamin. We will continue to monitor.   Orders: -     Zepbound ; Inject 7.5 mg into the skin once a week.  Dispense: 2 mL; Refill: 2  BMI 31.0-31.9,adult -     Zepbound ; Inject 7.5 mg into the skin once a week.  Dispense: 2 mL; Refill: 2  Essential hypertension Assessment & Plan: She has been off her amlodipine  for several weeks. Blood pressure is well-controlled at 126/88 mmHg today, likely due to weight loss, with potential for further improvement with continued weight loss. Continue monitoring blood pressure and reassess at the next visit. Hold off on resuming blood pressure medication until then.       Return in about 6 weeks (around 10/06/2024) for Follow up.   Leron Glance, NP-C [facility] [place]    [1]  Social History Tobacco Use  Smoking  Status Never  Smokeless Tobacco Never  [2]  Current Outpatient Medications on File Prior to Visit  Medication Sig Dispense Refill   amLODipine  (NORVASC ) 2.5 MG tablet TAKE 1 TABLET(2.5 MG) BY MOUTH DAILY 90 tablet 0   No current facility-administered medications on file prior to visit.   "

## 2024-08-25 NOTE — Assessment & Plan Note (Signed)
 Significant weight loss achieved, total 34 pounds, with Zepbound , now at 184 pounds, though weight loss has plateaued with increased hunger. Emphasized the importance of maintaining muscle mass and bone density through protein intake and weight-bearing exercises. Discussed the risk of weight regain post-medication without lifestyle changes. Increased Zepbound  to 7.5 mg weekly. Encourage healthy diet and high protein diet. Recommend regular exercise and a daily vitamin D  supplement or multivitamin. We will continue to monitor.

## 2024-10-07 ENCOUNTER — Ambulatory Visit: Admitting: Nurse Practitioner
